# Patient Record
Sex: Female | Born: 1990 | Race: White | Hispanic: No | Marital: Single | Smoking: Never smoker
Health system: Southern US, Community
[De-identification: ages and names within clinical notes are randomized; demographics above are authoritative.]

## PROBLEM LIST (undated history)

## (undated) DIAGNOSIS — N63 Unspecified lump in unspecified breast: Secondary | ICD-10-CM

---

## 2011-06-15 ENCOUNTER — Emergency Department: Payer: Self-pay | Admitting: Emergency Medicine

## 2014-03-24 ENCOUNTER — Ambulatory Visit: Payer: Self-pay | Admitting: Physician Assistant

## 2014-03-24 LAB — URINALYSIS, COMPLETE
Glucose,UR: NEGATIVE
Ketone: 15
Leukocyte Esterase: NEGATIVE
Nitrite: NEGATIVE
Ph: 5.5 (ref 5.0–8.0)
Protein: 30
RBC,UR: >30 /HPF (ref 0–5)
Specific Gravity: >=1.03 (ref 1.000–1.030)
WBC UR: NONE SEEN /HPF (ref 0–5)

## 2014-03-24 LAB — CBC WITH DIFFERENTIAL/PLATELET
Basophil #: 0.1 10*3/uL (ref 0.0–0.1)
Basophil %: 0.5 %
Eosinophil #: 0.2 10*3/uL (ref 0.0–0.7)
Eosinophil %: 1.8 %
HCT: 40.1 % (ref 35.0–47.0)
HGB: 13.4 g/dL (ref 12.0–16.0)
Lymphocyte #: 2.5 10*3/uL (ref 1.0–3.6)
Lymphocyte %: 23.4 %
MCH: 31 pg (ref 26.0–34.0)
MCHC: 33.5 g/dL (ref 32.0–36.0)
MCV: 93 fL (ref 80–100)
Monocyte #: 0.7 x10 3/mm (ref 0.2–0.9)
Monocyte %: 6.9 %
Neutrophil #: 7.2 10*3/uL — ABNORMAL HIGH (ref 1.4–6.5)
Neutrophil %: 67.4 %
Platelet: 270 10*3/uL (ref 150–440)
RBC: 4.33 10*6/uL (ref 3.80–5.20)
RDW: 12.3 % (ref 11.5–14.5)
WBC: 10.7 10*3/uL (ref 3.6–11.0)

## 2014-03-24 LAB — COMPREHENSIVE METABOLIC PANEL
Albumin: 4.3 g/dL (ref 3.4–5.0)
Alkaline Phosphatase: 55 U/L
Anion Gap: 8 (ref 7–16)
BUN: 12 mg/dL (ref 7–18)
Bilirubin,Total: 0.7 mg/dL (ref 0.2–1.0)
Calcium, Total: 9 mg/dL (ref 8.5–10.1)
Chloride: 101 mmol/L (ref 98–107)
Co2: 31 mmol/L (ref 21–32)
Creatinine: 0.75 mg/dL (ref 0.60–1.30)
EGFR (African American): >60
EGFR (Non-African Amer.): >60
Glucose: 90 mg/dL (ref 65–99)
Osmolality: 279 (ref 275–301)
Potassium: 3.7 mmol/L (ref 3.5–5.1)
SGOT(AST): 11 U/L — ABNORMAL LOW (ref 15–37)
SGPT (ALT): 13 U/L — ABNORMAL LOW
Sodium: 140 mmol/L (ref 136–145)
Total Protein: 7.5 g/dL (ref 6.4–8.2)

## 2014-03-26 LAB — URINE CULTURE

## 2014-11-19 ENCOUNTER — Other Ambulatory Visit: Payer: Self-pay | Admitting: Physician Assistant

## 2014-11-19 ENCOUNTER — Ambulatory Visit
Admission: RE | Admit: 2014-11-19 | Discharge: 2014-11-19 | Disposition: A | Discharge status: Home / Self Care | Payer: BLUE CROSS/BLUE SHIELD | Source: Ambulatory Visit | Attending: Physician Assistant | Admitting: Physician Assistant

## 2014-11-19 DIAGNOSIS — M546 Pain in thoracic spine: Secondary | ICD-10-CM

## 2014-11-19 DIAGNOSIS — M545 Low back pain: Secondary | ICD-10-CM | POA: Insufficient documentation

## 2016-12-29 ENCOUNTER — Other Ambulatory Visit: Payer: Self-pay | Admitting: Obstetrics & Gynecology

## 2016-12-29 DIAGNOSIS — N6311 Unspecified lump in the right breast, upper outer quadrant: Secondary | ICD-10-CM

## 2017-01-04 ENCOUNTER — Ambulatory Visit
Admission: RE | Admit: 2017-01-04 | Discharge: 2017-01-04 | Disposition: A | Discharge status: Home / Self Care | Payer: Self-pay | Source: Ambulatory Visit | Attending: Obstetrics & Gynecology | Admitting: Obstetrics & Gynecology

## 2017-01-04 DIAGNOSIS — N6311 Unspecified lump in the right breast, upper outer quadrant: Secondary | ICD-10-CM

## 2017-01-04 HISTORY — DX: Unspecified lump in unspecified breast: N63.0

## 2017-08-24 ENCOUNTER — Ambulatory Visit
Admission: EM | Admit: 2017-08-24 | Discharge: 2017-08-24 | Disposition: A | Discharge status: Home / Self Care | Payer: Self-pay | Attending: Family Medicine | Admitting: Family Medicine

## 2017-08-24 ENCOUNTER — Encounter: Payer: Self-pay | Admitting: Emergency Medicine

## 2017-08-24 ENCOUNTER — Other Ambulatory Visit: Payer: Self-pay

## 2017-08-24 DIAGNOSIS — F410 Panic disorder [episodic paroxysmal anxiety] without agoraphobia: Secondary | ICD-10-CM

## 2017-08-24 MED ORDER — CITALOPRAM HYDROBROMIDE 10 MG PO TABS: 10.0000 mg | ORAL_TABLET | Freq: Every day | ORAL | 0 refills | Status: DC

## 2017-08-24 NOTE — ED Triage Notes (Signed)
Patient states she had a panic attack last night and felt her heart pounding and heart fluttering

## 2017-08-24 NOTE — ED Provider Notes (Signed)
[facility]  CSN: [id number] Arrival date & time: 08/24/17  1717  History   Chief Complaint Chief Complaint  Patient presents with  . Panic Attack   HPI  27 year old female presents with the above complaint.  Patient states that yesterday  around 2 PM she was driving.  She states that she felt her heart racing/fluttering.  She states that she had chest tightness, shortness of breath, and chest pressure.  She states that after rest and deep breathing, her symptoms improved.  Patient feels that she had a panic attack.  She has had ongoing stressors.  She reports life stressors and a recent death in the family.  She states that she has not grieved the loss very well.  She currently feels anxious and still has a sensation of chest pressure.  She is feeling improved.  She feels anxious.  No other associated symptoms.  No known inciting factor.  Possibly exacerbated by ongoing stressors.  No other complaints.  Past Medical History:  Diagnosis Date  . Breast mass    Surgical hx: No past surgeries.  Home Medications    Prior to Admission medications   Medication Sig Start Date End Date Taking? Authorizing Provider  citalopram (CELEXA) 10 MG tablet Take 1 tablet (10 mg total) by mouth daily. 08/24/17   Tommie Samsook, Keiron Iodice G, DO   Family History Family History  Problem Relation Age of Onset  . Breast cancer Paternal Grandmother   . Diabetes Other    Social History Social History   Tobacco Use  . Smoking status: Never Smoker  Substance Use Topics  . Alcohol use: Yes  . Drug use: Yes    Types: Marijuana   Allergies   NKDA  Review of Systems Review of Systems  Respiratory: Positive for chest tightness and shortness of breath.   Cardiovascular: Positive for palpitations.  Psychiatric/Behavioral: The patient is nervous/anxious.    Physical Exam Triage Vital Signs ED Triage Vitals  Enc Vitals Group     BP 08/24/17 1730 121/61     Pulse Rate 08/24/17 1730 68     Resp  08/24/17 1730 16     Temp 08/24/17 1730 (!) 97.4 F (36.3 C)     Temp Source 08/24/17 1730 Oral     SpO2 08/24/17 1730 100 %     Weight 08/24/17 1726 150 lb (68 kg)     Height 08/24/17 1726 5\' 11"  (1.803 m)     Head Circumference --      Peak Flow --      Pain Score 08/24/17 1726 0     Pain Loc --      Pain Edu? --      Excl. in GC? --    Updated Vital Signs BP 121/61 (BP Location: Left Arm)   Pulse 68   Temp (!) 97.4 F (36.3 C) (Oral)   Resp 16   Ht 5\' 11"  (1.803 m)   Wt 150 lb (68 kg)   LMP 07/28/2017   SpO2 100%   BMI 20.92 kg/m   Physical Exam  Constitutional: She is oriented to person, place, and time. She appears well-developed. No distress.  HENT:  Head: Normocephalic and atraumatic.  Eyes: Conjunctivae are normal. Right eye exhibits no discharge. Left eye exhibits no discharge.  Neck: Neck supple. No thyromegaly present.  Cardiovascular: Normal rate and regular rhythm.  No murmur heard. Pulmonary/Chest: Effort normal and breath sounds normal. She has no wheezes. She has no rales.  Abdominal: Soft. She  exhibits no distension. There is no tenderness.  Neurological: She is alert and oriented to person, place, and time.  Psychiatric:  Anxious.  Nursing note and vitals reviewed.  UC Treatments / Results  Labs (all labs ordered are listed, but only abnormal results are displayed) Labs Reviewed - No data to display  EKG None Radiology No results found.  Procedures Procedures (including critical care time)  Medications Ordered in UC Medications - No data to display   Initial Impression / Assessment and Plan / UC Course  I have reviewed the triage vital signs and the nursing notes.  Pertinent labs & imaging results that were available during my care of the patient were reviewed by me and considered in my medical decision making (see chart for details).    27 year old female presents with panic attack.  Exam unremarkable.  Lengthy discussion with the  patient today.  Placing on Celexa.  Final Clinical Impressions(s) / UC Diagnoses   Final diagnoses:  Panic attack    ED Discharge Orders        Ordered    citalopram (CELEXA) 10 MG tablet  Daily     08/24/17 1757     Controlled Substance Prescriptions [place] Controlled Substance Registry consulted? Not Applicable   Tommie Sams, DO 08/24/17 1815

## 2017-11-19 ENCOUNTER — Other Ambulatory Visit: Payer: Self-pay | Admitting: Family Medicine

## 2019-01-30 ENCOUNTER — Encounter: Payer: Self-pay | Admitting: Obstetrics and Gynecology

## 2019-01-30 ENCOUNTER — Ambulatory Visit (INDEPENDENT_AMBULATORY_CARE_PROVIDER_SITE_OTHER): Payer: Medicaid Other | Admitting: Obstetrics and Gynecology

## 2019-01-30 ENCOUNTER — Other Ambulatory Visit: Payer: Self-pay

## 2019-01-30 VITALS — BP 107/67 | HR 88 | Ht 70.0 in | Wt 157.9 lb

## 2019-01-30 DIAGNOSIS — N926 Irregular menstruation, unspecified: Secondary | ICD-10-CM | POA: Diagnosis not present

## 2019-01-30 DIAGNOSIS — Z3491 Encounter for supervision of normal pregnancy, unspecified, first trimester: Secondary | ICD-10-CM

## 2019-01-30 LAB — OB RESULTS CONSOLE VARICELLA ZOSTER ANTIBODY, IGG: Varicella: IMMUNE

## 2019-01-30 LAB — OB RESULTS CONSOLE GC/CHLAMYDIA: Gonorrhea: NEGATIVE

## 2019-01-30 LAB — POCT URINE PREGNANCY: Preg Test, Ur: POSITIVE — AB

## 2019-01-30 MED ORDER — ONDANSETRON 4 MG PO TBDP: 4.0000 mg | ORAL_TABLET | Freq: Four times a day (QID) | ORAL | 0 refills | Status: DC | PRN

## 2019-01-30 MED ORDER — PROMETHAZINE HCL 25 MG PO TABS: 25.0000 mg | ORAL_TABLET | Freq: Four times a day (QID) | ORAL | 2 refills | Status: DC | PRN

## 2019-01-30 NOTE — Progress Notes (Signed)
  Subjective:     Patient ID: Alicia Stephens, female   DOB: 1990/09/28, 28 y.o.   MRN: [id number]  HPI  28yo engaged white female.  Here for pregnancy confirmation, reports LMP 11/14/2018, which gives EDC 08/21/19 and EGA [redacted]w[redacted]d. Reports nausea daily, some worse than others, and +home Aug 14th. This is her first pregnancy. desires meds for nausea for prn use.  She is working [employer]. Is sexually active with female partner.   Review of Systems  Constitutional: Positive for fatigue.  Gastrointestinal: Positive for nausea and vomiting.       Objective:   Physical Exam A&Ox4 Well groomed female in no distress Vitals with BMI 01/30/2019 08/24/2017  Height 5\' 10"  5\' 11"   Weight 157 lbs 14 oz 150 lbs  BMI 26.33 35.45  Systolic 625 638  Diastolic 67 61  Pulse 88 68  UPT+ FHT 165    Assessment:     Missed menses Nausea and vomiting    Plan:     Will draw prenatal panel with genetic screening today. Phenergan and zofran ordered for prn use RTC in 2 weeks for New OB PE. Congratulated on pregnancy and reviewed routine prenatal care.   Melody Shambley,CNM

## 2019-01-30 NOTE — Patient Instructions (Signed)
Second Trimester of Pregnancy The second trimester is from week 14 through week 27 (months 4 through 6). The second trimester is often a time when you feel your best. Your body has adjusted to being pregnant, and you begin to feel better physically. Usually, morning sickness has lessened or quit completely, you may have more energy, and you may have an increase in appetite. The second trimester is also a time when the fetus is growing rapidly. At the end of the sixth month, the fetus is about 9 inches long and weighs about 1 pounds. You will likely begin to feel the baby move (quickening) between 16 and 20 weeks of pregnancy. Body changes during your second trimester Your body continues to go through many changes during your second trimester. The changes vary from woman to woman.  Your weight will continue to increase. You will notice your lower abdomen bulging out.  You may begin to get stretch marks on your hips, abdomen, and breasts.  You may develop headaches that can be relieved by medicines. The medicines should be approved by your health care provider.  You may urinate more often because the fetus is pressing on your bladder.  You may develop or continue to have heartburn as a result of your pregnancy.  You may develop constipation because certain hormones are causing the muscles that push waste through your intestines to slow down.  You may develop hemorrhoids or swollen, bulging veins (varicose veins).  You may have back pain. This is caused by: ? Weight gain. ? Pregnancy hormones that are relaxing the joints in your pelvis. ? A shift in weight and the muscles that support your balance.  Your breasts will continue to grow and they will continue to become tender.  Your gums may bleed and may be sensitive to brushing and flossing.  Dark spots or blotches (chloasma, mask of pregnancy) may develop on your face. This will likely fade after the baby is born.  A dark line from your  belly button to the pubic area (linea nigra) may appear. This will likely fade after the baby is born.  You may have changes in your hair. These can include thickening of your hair, rapid growth, and changes in texture. Some women also have hair loss during or after pregnancy, or hair that feels dry or thin. Your hair will most likely return to normal after your baby is born. What to expect at prenatal visits During a routine prenatal visit:  You will be weighed to make sure you and the fetus are growing normally.  Your blood pressure will be taken.  Your abdomen will be measured to track your baby's growth.  The fetal heartbeat will be listened to.  Any test results from the previous visit will be discussed. Your health care provider may ask you:  How you are feeling.  If you are feeling the baby move.  If you have had any abnormal symptoms, such as leaking fluid, bleeding, severe headaches, or abdominal cramping.  If you are using any tobacco products, including cigarettes, chewing tobacco, and electronic cigarettes.  If you have any questions. Other tests that may be performed during your second trimester include:  Blood tests that check for: ? Low iron levels (anemia). ? High blood sugar that affects pregnant women (gestational diabetes) between 28 and 28 weeks. ? Rh antibodies. This is to check for a protein on red blood cells (Rh factor).  Urine tests to check for infections, diabetes, or protein in the  urine.  An ultrasound to confirm the proper growth and development of the baby.  An amniocentesis to check for possible genetic problems.  Fetal screens for spina bifida and Down syndrome.  HIV (human immunodeficiency virus) testing. Routine prenatal testing includes screening for HIV, unless you choose not to have this test. Follow these instructions at home: Medicines  Follow your health care provider's instructions regarding medicine use. Specific medicines may be  either safe or unsafe to take during pregnancy.  Take a prenatal vitamin that contains at least 600 micrograms (mcg) of folic acid.  If you develop constipation, try taking a stool softener if your health care provider approves. Eating and drinking   Eat a balanced diet that includes fresh fruits and vegetables, whole grains, good sources of protein such as meat, eggs, or tofu, and low-fat dairy. Your health care provider will help you determine the amount of weight gain that is right for you.  Avoid raw meat and uncooked cheese. These carry germs that can cause birth defects in the baby.  If you have low calcium intake from food, talk to your health care provider about whether you should take a daily calcium supplement.  Limit foods that are high in fat and processed sugars, such as fried and sweet foods.  To prevent constipation: ? Drink enough fluid to keep your urine clear or pale yellow. ? Eat foods that are high in fiber, such as fresh fruits and vegetables, whole grains, and beans. Activity  Exercise only as directed by your health care provider. Most women can continue their usual exercise routine during pregnancy. Try to exercise for 30 minutes at least 5 days a week. Stop exercising if you experience uterine contractions.  Avoid heavy lifting, wear low heel shoes, and practice good posture.  A sexual relationship may be continued unless your health care provider directs you otherwise. Relieving pain and discomfort  Wear a good support bra to prevent discomfort from breast tenderness.  Take warm sitz baths to soothe any pain or discomfort caused by hemorrhoids. Use hemorrhoid cream if your health care provider approves.  Rest with your legs elevated if you have leg cramps or low back pain.  If you develop varicose veins, wear support hose. Elevate your feet for 15 minutes, 3-4 times a day. Limit salt in your diet. Prenatal Care  Write down your questions. Take them to  your prenatal visits.  Keep all your prenatal visits as told by your health care provider. This is important. Safety  Wear your seat belt at all times when driving.  Make a list of emergency phone numbers, including numbers for family, friends, the hospital, and police and fire departments. General instructions  Ask your health care provider for a referral to a local prenatal education class. Begin classes no later than the beginning of month 6 of your pregnancy.  Ask for help if you have counseling or nutritional needs during pregnancy. Your health care provider can offer advice or refer you to specialists for help with various needs.  Do not use hot tubs, steam rooms, or saunas.  Do not douche or use tampons or scented sanitary pads.  Do not cross your legs for long periods of time.  Avoid cat litter boxes and soil used by cats. These carry germs that can cause birth defects in the baby and possibly loss of the fetus by miscarriage or stillbirth.  Avoid all smoking, herbs, alcohol, and unprescribed drugs. Chemicals in these products can affect the formation  and growth of the baby.  Do not use any products that contain nicotine or tobacco, such as cigarettes and e-cigarettes. If you need help quitting, ask your health care provider.  Visit your dentist if you have not gone yet during your pregnancy. Use a soft toothbrush to brush your teeth and be gentle when you floss. Contact a health care provider if:  You have dizziness.  You have mild pelvic cramps, pelvic pressure, or nagging pain in the abdominal area.  You have persistent nausea, vomiting, or diarrhea.  You have a bad smelling vaginal discharge.  You have pain when you urinate. Get help right away if:  You have a fever.  You are leaking fluid from your vagina.  You have spotting or bleeding from your vagina.  You have severe abdominal cramping or pain.  You have rapid weight gain or weight loss.  You have  shortness of breath with chest pain.  You notice sudden or extreme swelling of your face, hands, ankles, feet, or legs.  You have not felt your baby move in over an hour.  You have severe headaches that do not go away when you take medicine.  You have vision changes. Summary  The second trimester is from week 14 through week 27 (months 4 through 6). It is also a time when the fetus is growing rapidly.  Your body goes through many changes during pregnancy. The changes vary from woman to woman.  Avoid all smoking, herbs, alcohol, and unprescribed drugs. These chemicals affect the formation and growth your baby.  Do not use any tobacco products, such as cigarettes, chewing tobacco, and e-cigarettes. If you need help quitting, ask your health care provider.  Contact your health care provider if you have any questions. Keep all prenatal visits as told by your health care provider. This is important. This information is not intended to replace advice given to you by your health care provider. Make sure you discuss any questions you have with your health care provider. Document Released: 04/12/2001 Document Revised: 08/10/2018 Document Reviewed: 05/24/2016 Elsevier Patient Education  2020 [employer]. Commonly Asked Questions During Pregnancy  Cats: A parasite can be excreted in cat feces.  To avoid exposure you need to have another person empty the little box.  If you must empty the litter box you will need to wear gloves.  Wash your hands after handling your cat.  This parasite can also be found in raw or undercooked meat so this should also be avoided.  Colds, Sore Throats, Flu: Please check your medication sheet to see what you can take for symptoms.  If your symptoms are unrelieved by these medications please call the office.  Dental Work: Most any dental work Investment banker, corporate recommends is permitted.  X-rays should only be taken during the first trimester if absolutely necessary.  Your  abdomen should be shielded with a lead apron during all x-rays.  Please notify your provider prior to receiving any x-rays.  Novocaine is fine; gas is not recommended.  If your dentist requires a note from [place] prior to dental work please call the office and we will provide one for you.  Exercise: Exercise is an important part of staying healthy during your pregnancy.  You may continue most exercises you were accustomed to prior to pregnancy.  Later in your pregnancy you will most likely notice you have difficulty with activities requiring balance like riding a bicycle.  It is important that you listen to your body and  avoid activities that put you at a higher risk of falling.  Adequate rest and staying well hydrated are a must!  If you have questions about the safety of specific activities ask your provider.    Exposure to Children with illness: Try to avoid obvious exposure; report any symptoms to [place] when noted,  If you have chicken pos, red measles or mumps, you should be immune to these diseases.   Please do not take any vaccines while pregnant unless you have checked with your OB provider.  Fetal Movement: After 28 weeks we recommend you do "kick counts" twice daily.  Lie or sit down in a calm quiet environment and count your baby movements "kicks".  You should feel your baby at least 10 times per hour.  If you have not felt 10 kicks within the first hour get up, walk around and have something sweet to eat or drink then repeat for an additional hour.  If count remains less than 10 per hour notify your provider.  Fumigating: Follow your pest control agent's advice as to how long to stay out of your home.  Ventilate the area well before re-entering.  Hemorrhoids:   Most over-the-counter preparations can be used during pregnancy.  Check your medication to see what is safe to use.  It is important to use a stool softener or fiber in your diet and to drink lots of liquids.  If hemorrhoids seem to be getting  worse please call the office.   Hot Tubs:  Hot tubs Jacuzzis and saunas are not recommended while pregnant.  These increase your internal body temperature and should be avoided.  Intercourse:  Sexual intercourse is safe during pregnancy as long as you are comfortable, unless otherwise advised by your provider.  Spotting may occur after intercourse; report any bright red bleeding that is heavier than spotting.  Labor:  If you know that you are in labor, please go to the hospital.  If you are unsure, please call the office and let us help you decide what to do.  Lifting, straining, etc:  If your job requires heavy lifting or straining please check with your provider for any limitations.  Generally, you should not lift items heavier than that you can lift simply with your hands and arms (no back muscles)  Painting:  Paint fumes do not harm your pregnancy, but may make you ill and should be avoided if possible.  Latex or water based paints have less odor than oils.  Use adequate ventilation while painting.  Permanents & Hair Color:  Chemicals in hair dyes are not recommended as they cause increase hair dryness which can increase hair loss during pregnancy.  " Highlighting" and permanents are allowed.  Dye may be absorbed differently and permanents may not hold as well during pregnancy.  Sunbathing:  Use a sunscreen, as skin burns easily during pregnancy.  Drink plenty of fluids; avoid over heating.  Tanning Beds:  Because their possible side effects are still unknown, tanning beds are not recommended.  Ultrasound Scans:  Routine ultrasounds are performed at approximately 20 weeks.  You will be able to see your baby's general anatomy an if you would like to know the gender this can usually be determined as well.  If it is questionable when you conceived you may also receive an ultrasound early in your pregnancy for dating purposes.  Otherwise ultrasound exams are not routinely performed unless there is  a medical necessity.  Although you can request a scan  we ask that you pay for it when conducted because insurance does not cover " patient request" scans.  Work: If your pregnancy proceeds without complications you may work until your due date, unless your physician or employer advises otherwise.  Round Ligament Pain/Pelvic Discomfort:  Sharp, shooting pains not associated with bleeding are fairly common, usually occurring in the second trimester of pregnancy.  They tend to be worse when standing up or when you remain standing for long periods of time.  These are the result of pressure of certain pelvic ligaments called "round ligaments".  Rest, Tylenol and heat seem to be the most effective relief.  As the womb and fetus grow, they rise out of the pelvis and the discomfort improves.  Please notify the office if your pain seems different than that described.  It may represent a more serious condition.  Common Medications Safe in Pregnancy  Acne:      Constipation:  Benzoyl Peroxide     Colace  Clindamycin      Dulcolax Suppository  Topica Erythromycin     Fibercon  Salicylic Acid      Metamucil         Miralax AVOID:        Senakot   Accutane    Cough:  Retin-A       Cough Drops  Tetracycline      Phenergan w/ Codeine if Rx  Minocycline      Robitussin (Plain & DM)  Antibiotics:     Crabs/Lice:  Ceclor       RID  Cephalosporins    AVOID:  E-Mycins      Kwell  Keflex  Macrobid/Macrodantin   Diarrhea:  Penicillin      Kao-Pectate  Zithromax      Imodium AD         PUSH FLUIDS AVOID:       Cipro     Fever:  Tetracycline      Tylenol (Regular or Extra  Minocycline       Strength)  Levaquin      Extra Strength-Do not          Exceed 8 tabs/24 hrs Caffeine:        <213m/day (equiv. To 1 cup of coffee or  approx. 3 12 oz  sodas)         Gas: Cold/Hayfever:       Gas-X  Benadryl      Mylicon  Claritin       Phazyme  **Claritin-D        Chlor-Trimeton    Headaches:  Dimetapp      ASA-Free Excedrin  Drixoral-Non-Drowsy     Cold Compress  Mucinex (Guaifenasin)     Tylenol (Regular or Extra  Sudafed/Sudafed-12 Hour     Strength)  **Sudafed PE Pseudoephedrine   Tylenol Cold & Sinus     Vicks Vapor Rub  Zyrtec  **AVOID if Problems With Blood Pressure         Heartburn: Avoid lying down for at least 1 hour after meals  Aciphex      Maalox     Rash:  Milk of Magnesia     Benadryl    Mylanta       1% Hydrocortisone Cream  Pepcid  Pepcid Complete   Sleep Aids:  Prevacid      Ambien   Prilosec       Benadryl  Rolaids       Chamomile Tea  Tums (Limit  4/day)     Unisom  Zantac       Tylenol PM         Warm milk-add vanilla or  Hemorrhoids:       Sugar for taste  Anusol/Anusol H.C.  (RX: Analapram 2.5%)  Sugar Substitutes:  Hydrocortisone OTC     Ok in moderation  Preparation H      Tucks        Vaseline lotion applied to tissue with wiping    Herpes:     Throat:  Acyclovir      Oragel  Famvir  Valtrex     Vaccines:         Flu Shot Leg Cramps:       *Gardasil  Benadryl      Hepatitis A         Hepatitis B Nasal Spray:       Pneumovax  Saline Nasal Spray     Polio Booster         Tetanus Nausea:       Tuberculosis test or PPD  Vitamin B6 25 mg TID   AVOID:    Dramamine      *Gardasil  Emetrol       Live Poliovirus  Ginger Root 250 mg QID    MMR (measles, mumps &  High Complex Carbs @ Bedtime    rebella)  Sea Bands-Accupressure    Varicella (Chickenpox)  Unisom 1/2 tab TID     *No known complications           If received before Pain:         Known pregnancy;   Darvocet       Resume series after  Lortab        Delivery  Percocet    Yeast:   Tramadol      Femstat  Tylenol 3      Gyne-lotrimin  Ultram       Monistat  Vicodin           MISC:         All  Sunscreens           Hair Coloring/highlights          Insect Repellant's          (Including DEET)         Mystic Tans

## 2019-01-31 LAB — URINALYSIS, ROUTINE W REFLEX MICROSCOPIC
Bilirubin, UA: NEGATIVE
Glucose, UA: NEGATIVE
Ketones, UA: NEGATIVE
Leukocytes,UA: NEGATIVE
Nitrite, UA: NEGATIVE
Protein,UA: NEGATIVE
RBC, UA: NEGATIVE
Specific Gravity, UA: 1.02 (ref 1.005–1.030)
Urobilinogen, Ur: 0.2 mg/dL (ref 0.2–1.0)
pH, UA: 7 (ref 5.0–7.5)

## 2019-01-31 LAB — CBC WITH DIFFERENTIAL/PLATELET
Basophils Absolute: 0 10*3/uL (ref 0.0–0.2)
Basos: 0 %
EOS (ABSOLUTE): 0.1 10*3/uL (ref 0.0–0.4)
Eos: 1 %
Hematocrit: 38.8 % (ref 34.0–46.6)
Hemoglobin: 13.1 g/dL (ref 11.1–15.9)
Immature Grans (Abs): 0 10*3/uL (ref 0.0–0.1)
Immature Granulocytes: 0 %
Lymphocytes Absolute: 2 10*3/uL (ref 0.7–3.1)
Lymphs: 22 %
MCH: 30.4 pg (ref 26.6–33.0)
MCHC: 33.8 g/dL (ref 31.5–35.7)
MCV: 90 fL (ref 79–97)
Monocytes Absolute: 0.5 10*3/uL (ref 0.1–0.9)
Monocytes: 6 %
Neutrophils Absolute: 6.5 10*3/uL (ref 1.4–7.0)
Neutrophils: 71 %
Platelets: 346 10*3/uL (ref 150–450)
RBC: 4.31 x10E6/uL (ref 3.77–5.28)
RDW: 11.8 % (ref 11.7–15.4)
WBC: 9.2 10*3/uL (ref 3.4–10.8)

## 2019-01-31 LAB — RPR: RPR Ser Ql: NONREACTIVE

## 2019-01-31 LAB — TOXOPLASMA ANTIBODIES- IGG AND  IGM
Toxoplasma Antibody- IgM: <3 AU/mL (ref 0.0–7.9)
Toxoplasma IgG Ratio: <3 IU/mL (ref 0.0–7.1)

## 2019-01-31 LAB — RUBELLA SCREEN: Rubella Antibodies, IGG: 6.31 index (ref >0.99)

## 2019-01-31 LAB — ABO AND RH: Rh Factor: POSITIVE

## 2019-01-31 LAB — VARICELLA ZOSTER ANTIBODY, IGG: Varicella zoster IgG: 967 index (ref >165)

## 2019-01-31 LAB — HEPATITIS B SURFACE ANTIGEN: Hepatitis B Surface Ag: NEGATIVE

## 2019-01-31 LAB — ANTIBODY SCREEN: Antibody Screen: NEGATIVE

## 2019-01-31 LAB — HIV ANTIBODY (ROUTINE TESTING W REFLEX): HIV Screen 4th Generation wRfx: NONREACTIVE

## 2019-02-01 LAB — GC/CHLAMYDIA PROBE AMP
Chlamydia trachomatis, NAA: NEGATIVE
Neisseria Gonorrhoeae by PCR: NEGATIVE

## 2019-02-01 LAB — URINE CULTURE

## 2019-02-03 LAB — MONITOR DRUG PROFILE 14(MW)
Amphetamine Scrn, Ur: NEGATIVE ng/mL
BARBITURATE SCREEN URINE: NEGATIVE ng/mL
BENZODIAZEPINE SCREEN, URINE: NEGATIVE ng/mL
Buprenorphine, Urine: NEGATIVE ng/mL
Cocaine (Metab) Scrn, Ur: NEGATIVE ng/mL
Creatinine(Crt), U: 131.9 mg/dL (ref 20.0–300.0)
Fentanyl, Urine: NEGATIVE pg/mL
Meperidine Screen, Urine: NEGATIVE ng/mL
Methadone Screen, Urine: NEGATIVE ng/mL
OXYCODONE+OXYMORPHONE UR QL SCN: NEGATIVE ng/mL
Opiate Scrn, Ur: NEGATIVE ng/mL
Ph of Urine: 6.6 (ref 4.5–8.9)
Phencyclidine Qn, Ur: NEGATIVE ng/mL
Propoxyphene Scrn, Ur: NEGATIVE ng/mL
SPECIFIC GRAVITY: 1.018
Tramadol Screen, Urine: NEGATIVE ng/mL

## 2019-02-03 LAB — CANNABINOID (GC/MS), URINE
Cannabinoid: POSITIVE — AB
Carboxy THC (GC/MS): 145 ng/mL

## 2019-02-18 ENCOUNTER — Telehealth: Payer: Self-pay | Admitting: Obstetrics and Gynecology

## 2019-02-18 NOTE — Telephone Encounter (Signed)
Pt called to check the status of genetic screening results. Pt requesting a call back. Please advise.

## 2019-02-27 NOTE — Telephone Encounter (Signed)
I do not remember seeing them, cn we check with [facility]?

## 2019-02-28 ENCOUNTER — Other Ambulatory Visit: Payer: Self-pay

## 2019-02-28 ENCOUNTER — Encounter: Payer: Self-pay | Admitting: Obstetrics and Gynecology

## 2019-02-28 ENCOUNTER — Ambulatory Visit (INDEPENDENT_AMBULATORY_CARE_PROVIDER_SITE_OTHER): Payer: Medicaid Other | Admitting: Obstetrics and Gynecology

## 2019-02-28 ENCOUNTER — Telehealth: Payer: Self-pay

## 2019-02-28 ENCOUNTER — Other Ambulatory Visit (HOSPITAL_COMMUNITY)
Admission: RE | Admit: 2019-02-28 | Discharge: 2019-02-28 | Disposition: A | Discharge status: Home / Self Care | Payer: Medicaid Other | Source: Ambulatory Visit | Attending: Obstetrics and Gynecology | Admitting: Obstetrics and Gynecology

## 2019-02-28 VITALS — BP 105/70 | HR 89 | Wt 160.7 lb

## 2019-02-28 DIAGNOSIS — Z3402 Encounter for supervision of normal first pregnancy, second trimester: Secondary | ICD-10-CM | POA: Diagnosis present

## 2019-02-28 LAB — POCT URINALYSIS DIPSTICK OB
Bilirubin, UA: NEGATIVE
Blood, UA: NEGATIVE
Glucose, UA: NEGATIVE
Ketones, UA: NEGATIVE
Leukocytes, UA: NEGATIVE
Nitrite, UA: NEGATIVE
POC,PROTEIN,UA: NEGATIVE
Spec Grav, UA: 1.015 (ref 1.010–1.025)
Urobilinogen, UA: 0.2 E.U./dL
pH, UA: 7.5 (ref 5.0–8.0)

## 2019-02-28 MED ORDER — CITRANATAL BLOOM 90-1 MG PO TABS: 90.0000 mg | ORAL_TABLET | Freq: Every day | ORAL | 11 refills | Status: DC

## 2019-02-28 NOTE — Telephone Encounter (Signed)
mychart message sent to patient- call has been placed to [facility] at [facility] for [facility] [employer]

## 2019-02-28 NOTE — Patient Instructions (Signed)

## 2019-02-28 NOTE — Progress Notes (Signed)
Pt present for NOB PE. Pt stated that she was doing well no problems. Last pap unknown.

## 2019-03-06 LAB — CYTOLOGY - PAP: Diagnosis: NEGATIVE

## 2019-03-26 ENCOUNTER — Other Ambulatory Visit: Payer: Self-pay

## 2019-03-26 ENCOUNTER — Ambulatory Visit (INDEPENDENT_AMBULATORY_CARE_PROVIDER_SITE_OTHER): Payer: Medicaid Other | Admitting: Certified Nurse Midwife

## 2019-03-26 ENCOUNTER — Encounter: Payer: Self-pay | Admitting: Certified Nurse Midwife

## 2019-03-26 ENCOUNTER — Ambulatory Visit (INDEPENDENT_AMBULATORY_CARE_PROVIDER_SITE_OTHER): Payer: Medicaid Other

## 2019-03-26 VITALS — BP 105/62 | HR 74 | Wt 167.0 lb

## 2019-03-26 DIAGNOSIS — Z1389 Encounter for screening for other disorder: Secondary | ICD-10-CM

## 2019-03-26 DIAGNOSIS — Z3402 Encounter for supervision of normal first pregnancy, second trimester: Secondary | ICD-10-CM | POA: Diagnosis not present

## 2019-03-26 LAB — POCT URINALYSIS DIPSTICK OB
Bilirubin, UA: NEGATIVE
Blood, UA: NEGATIVE
Glucose, UA: NEGATIVE
Ketones, UA: NEGATIVE
Leukocytes, UA: NEGATIVE
Nitrite, UA: NEGATIVE
POC,PROTEIN,UA: NEGATIVE
Spec Grav, UA: 1.015 (ref 1.010–1.025)
Urobilinogen, UA: 0.2 E.U./dL
pH, UA: 6 (ref 5.0–8.0)

## 2019-03-26 NOTE — Addendum Note (Signed)
Addended by: Raliegh Ip on: 03/26/2019 04:11 PM   Modules accepted: Orders

## 2019-03-26 NOTE — Patient Instructions (Signed)

## 2019-03-26 NOTE — Progress Notes (Signed)
ROB and anatomy scan today. Result reviewed (see below-complete). Discussed dizziness in pregnancy-reviewed self help measures. Discussed round ligament. Pain. Follow up 4 wks.   Philip Aspen, CNM   Patient Name: Alicia Stephens DOB: 1991-01-21 MRN: [id number] ULTRASOUND REPORT  Location: [facility] Date of Service: 03/26/2019   Indications:Anatomy Ultrasound Findings:  Nelda Marseille intrauterine pregnancy is visualized with FHR at 152 BPM. Biometrics give an (U/S) Gestational age of [redacted]w[redacted]d and an (U/S) EDD of 08/18/2019; this correlates with the clinically established Estimated Date of Delivery: 08/21/19  Fetal presentation is Variable.  EFW: 275 g ( 10 oz). Fetal Percentile 36 Placenta: anterior. Grade: 1 AFI: subjectively normal.  Anatomic survey is complete and normal; Gender - female.    Right Ovary is normal in appearance. Left Ovary is normal appearance. Survey of the adnexa demonstrates no adnexal masses. There is no free peritoneal fluid in the cul de sac.  Impression: 1. [redacted]w[redacted]d Viable Singleton Intrauterine pregnancy by U/S. 2. (U/S) EDD is consistent with Clinically established Estimated Date of Delivery: 08/21/19 . 3. Normal Anatomy Scan  Recommendations: 1.Clinical correlation with the patient's History and Physical Exam.   Jenine M. Albertine Grates    RDMS

## 2019-03-27 ENCOUNTER — Telehealth: Payer: Self-pay | Admitting: Certified Nurse Midwife

## 2019-03-27 ENCOUNTER — Telehealth: Payer: Self-pay

## 2019-03-27 NOTE — Telephone Encounter (Signed)
Pt called in she is currently at the dentist and they need documention  faxed stating they have permission to do ex-rays. Please advise. [facility] Fax [phone]

## 2019-03-27 NOTE — Telephone Encounter (Signed)
Dental letter faxed to [phone] and confirmation received.

## 2019-04-17 ENCOUNTER — Other Ambulatory Visit: Payer: Self-pay

## 2019-04-17 ENCOUNTER — Other Ambulatory Visit: Payer: Self-pay | Admitting: Certified Nurse Midwife

## 2019-04-17 ENCOUNTER — Telehealth: Payer: Self-pay

## 2019-04-17 MED ORDER — VITAFOL FE+ 90-0.6-0.4-200 MG PO CAPS: 1.0000 | ORAL_CAPSULE | Freq: Every day | ORAL | 9 refills | Status: DC

## 2019-04-17 MED ORDER — CITRANATAL BLOOM 90-1 MG PO TABS: 90.0000 mg | ORAL_TABLET | Freq: Every day | ORAL | 11 refills | Status: DC

## 2019-04-17 NOTE — Telephone Encounter (Signed)
Refill sent per patient request.

## 2019-04-17 NOTE — Telephone Encounter (Signed)
Pt request refill Prenatal vitamins to [facility] on file.

## 2019-04-17 NOTE — Telephone Encounter (Signed)
Message from [place] unavailable. Replaced with Vitafol FE. Script sent to pharmacy.

## 2019-04-23 ENCOUNTER — Encounter: Payer: Self-pay | Admitting: Certified Nurse Midwife

## 2019-04-23 ENCOUNTER — Ambulatory Visit (INDEPENDENT_AMBULATORY_CARE_PROVIDER_SITE_OTHER): Payer: Medicaid Other | Admitting: Certified Nurse Midwife

## 2019-04-23 ENCOUNTER — Other Ambulatory Visit: Payer: Self-pay

## 2019-04-23 VITALS — BP 109/67 | HR 85 | Wt 173.6 lb

## 2019-04-23 DIAGNOSIS — Z3402 Encounter for supervision of normal first pregnancy, second trimester: Secondary | ICD-10-CM

## 2019-04-23 NOTE — Progress Notes (Signed)
ROB doing well. Feels good movement. Discussed 28 wk labs/visit. Pt verbalize and agree. Follow up 5 wks.   Philip Aspen, CNM

## 2019-04-23 NOTE — Patient Instructions (Signed)

## 2019-05-03 NOTE — L&D Delivery Note (Signed)
       Delivery Note   [place] Alexis Sumpter is a 29 y.o. G1P0 at [redacted]w[redacted]d Estimated Date of Delivery: 08/21/19  PRE-OPERATIVE DIAGNOSIS:  1) [redacted]w[redacted]d pregnancy.   POST-OPERATIVE DIAGNOSIS:  1) [redacted]w[redacted]d pregnancy s/p Vaginal, Spontaneous   Delivery Type: Vaginal, Spontaneous    Delivery Anesthesia: Epidural   Labor Complications: none    ESTIMATED BLOOD LOSS: 300 ml    FINDINGS:   1) female infant, Apgar scores of 7   at 1 minute and 9   at 5 minutes and a birthweight of 137.92  ounces.    2) Nuchal cord: no  SPECIMENS:   PLACENTA:   Appearance: Intact , 3 vessel cord   Removal: Spontaneous      Disposition:  held per protocol then released to pt.   DISPOSITION:  Infant to left in stable condition in the delivery room, with L&D personnel and mother,  NARRATIVE SUMMARY: Labor course:  Ms. [place] Alexis Buntrock is a G1P0 at [redacted]w[redacted]d who presented for induction of labor.  Foley bulb was placed in the office prior to admission. She progressed well in labor with cytotec, AROM and  pitocin.  She received the appropriate epidural anesthesia and proceeded to complete dilation. She evidenced good maternal expulsive effort during the second stage. She went on to deliver a viable female infant ":Ophelia". The placenta delivered without problems and was noted to be complete. A perineal and vaginal examination was performed. Lacerations: Vaginal , bilateral side wall. The lacerations were repaired with a 3-0 Vicryl Rapide suture. The patient tolerated this well. 800 mcg cytotec placed rectally due to scant to moderate bleeding. Will continue to monitor.   Doreene Burke, CNM 08/27/2019 5:40 PM

## 2019-05-06 ENCOUNTER — Telehealth: Payer: Self-pay | Admitting: Certified Nurse Midwife

## 2019-05-06 ENCOUNTER — Telehealth: Payer: Self-pay

## 2019-05-06 NOTE — Telephone Encounter (Signed)
Mychart message sent to patient.

## 2019-05-06 NOTE — Telephone Encounter (Signed)
Pt called in pt is having heartburn pt asked was pepcid complete. Pt is requesting a call.

## 2019-05-28 ENCOUNTER — Other Ambulatory Visit: Payer: Medicaid Other

## 2019-05-28 ENCOUNTER — Other Ambulatory Visit: Payer: Self-pay

## 2019-05-28 ENCOUNTER — Ambulatory Visit (INDEPENDENT_AMBULATORY_CARE_PROVIDER_SITE_OTHER): Payer: Medicaid Other | Admitting: Certified Nurse Midwife

## 2019-05-28 ENCOUNTER — Encounter: Payer: Self-pay | Admitting: Certified Nurse Midwife

## 2019-05-28 DIAGNOSIS — Z23 Encounter for immunization: Secondary | ICD-10-CM | POA: Diagnosis not present

## 2019-05-28 DIAGNOSIS — Z3402 Encounter for supervision of normal first pregnancy, second trimester: Secondary | ICD-10-CM | POA: Diagnosis not present

## 2019-05-28 LAB — POCT URINALYSIS DIPSTICK OB
Bilirubin, UA: NEGATIVE
Blood, UA: NEGATIVE
Glucose, UA: NEGATIVE
Ketones, UA: NEGATIVE
Leukocytes, UA: NEGATIVE
Nitrite, UA: NEGATIVE
POC,PROTEIN,UA: NEGATIVE
Spec Grav, UA: 1.02 (ref 1.010–1.025)
Urobilinogen, UA: 0.2 E.U./dL
pH, UA: 5 (ref 5.0–8.0)

## 2019-05-28 MED ORDER — VITAFOL FE+ 90-0.6-0.4-200 MG PO CAPS: 1.0000 | ORAL_CAPSULE | Freq: Every day | ORAL | 9 refills | Status: DC

## 2019-05-28 MED ORDER — TETANUS-DIPHTH-ACELL PERTUSSIS 5-2.5-18.5 LF-MCG/0.5 IM SUSP
0.5000 mL | Freq: Once | INTRAMUSCULAR | Status: AC
  Administered 2019-05-28: 0.5 mL via INTRAMUSCULAR

## 2019-05-28 NOTE — Telephone Encounter (Signed)
Prenatal vits refill sent

## 2019-05-28 NOTE — Progress Notes (Signed)
ROB doing well. Feels good movement. 28 wk labs today. Glucose/Tdap/BTC/CBC/RPR. Discussed BC after delivery, booklet given. Sample birth plan given. Pt encoraged to review with her partner . Will follow up at next appointment. She has a breast pump that her friend got for her. She is having a dental procedure-not given. Baby name is Patent attorney. Follow up 2 wks,   Doreene Burke, CNM

## 2019-05-28 NOTE — Patient Instructions (Signed)
Td (Tetanus, Diphtheria) Vaccine: What You Need to Know 1. Why get vaccinated? Td vaccine can prevent tetanus and diphtheria. Tetanus enters the body through cuts or wounds. Diphtheria spreads from person to person.  TETANUS (T) causes painful stiffening of the muscles. Tetanus can lead to serious health problems, including being unable to open the mouth, having trouble swallowing and breathing, or death.  DIPHTHERIA (D) can lead to difficulty breathing, heart failure, paralysis, or death. 2. Td vaccine Td is only for children 7 years and older, adolescents, and adults.  Td is usually given as a booster dose every 10 years, but it can also be given earlier after a severe and dirty wound or burn. Another vaccine, called Tdap, that protects against pertussis, also known as "whooping cough," in addition to tetanus and diphtheria, may be used instead of Td.  Td may be given at the same time as other vaccines. 3. Talk with your health care provider Tell your vaccine provider if the person getting the vaccine:  Has had an allergic reaction after a previous dose of any vaccine that protects against tetanus or diphtheria, or has any severe, life-threatening allergies.  Has ever had Guillain-Barr Syndrome (also called GBS).  Has had severe pain or swelling after a previous dose of any vaccine that protects against tetanus or diphtheria. In some cases, your health care provider may decide to postpone Td vaccination to a future visit.  People with minor illnesses, such as a cold, may be vaccinated. People who are moderately or severely ill should usually wait until they recover before getting Td vaccine.  Your health care provider can give you more information. 4. Risks of a vaccine reaction  Pain, redness, or swelling where the shot was given, mild fever, headache, feeling tired, and nausea, vomiting, diarrhea, or stomachache sometimes happen after Td vaccine. People sometimes faint after medical  procedures, including vaccination. Tell your provider if you feel dizzy or have vision changes or ringing in the ears.  As with any medicine, there is a very remote chance of a vaccine causing a severe allergic reaction, other serious injury, or death. 5. What if there is a serious problem? An allergic reaction could occur after the vaccinated person leaves the clinic. If you see signs of a severe allergic reaction (hives, swelling of the face and throat, difficulty breathing, a fast heartbeat, dizziness, or weakness), call 9-1-1 and get the person to the nearest hospital.  For other signs that concern you, call your health care provider.  Adverse reactions should be reported to the Vaccine Adverse Event Reporting System (VAERS). Your health care provider will usually file this report, or you can do it yourself. Visit the VAERS website at www.vaers.hhs.gov or call 1-800-822-7967. VAERS is only for reporting reactions, and VAERS staff do not give medical advice. 6. The National Vaccine Injury Compensation Program The National Vaccine Injury Compensation Program (VICP) is a federal program that was created to compensate people who may have been injured by certain vaccines. Visit the VICP website at www.hrsa.gov/vaccinecompensation or call 1-800-338-2382 to learn about the program and about filing a claim. There is a time limit to file a claim for compensation. 7. How can I learn more?  Ask your health care provider.  Call your local or state health department.  Contact the Centers for Disease Control and Prevention (CDC): ? Call 1-800-232-4636 (1-800-CDC-INFO) or ? Visit CDC's website at www.cdc.gov/vaccines Vaccine Information Statement Td Vaccine (08/01/18) This information is not intended to replace advice given   to you by your health care provider. Make sure you discuss any questions you have with your health care provider. Document Revised: 09/10/2018 Document Reviewed: 08/13/2018 Elsevier  Patient Education  2020 Elsevier Inc. Glucose Tolerance Test During Pregnancy Why am I having this test? The glucose tolerance test (GTT) is done to check how your body processes sugar (glucose). This is one of several tests used to diagnose diabetes that develops during pregnancy (gestational diabetes mellitus). Gestational diabetes is a temporary form of diabetes that some women develop during pregnancy. It usually occurs during the second trimester of pregnancy and goes away after delivery. Testing (screening) for gestational diabetes usually occurs between 24 and 28 weeks of pregnancy. You may have the GTT test after having a 1-hour glucose screening test if the results from that test indicate that you may have gestational diabetes. You may also have this test if:  You have a history of gestational diabetes.  You have a history of giving birth to very large babies or have experienced repeated fetal loss (stillbirth).  You have signs and symptoms of diabetes, such as: ? Changes in your vision. ? Tingling or numbness in your hands or feet. ? Changes in hunger, thirst, and urination that are not otherwise explained by your pregnancy. What is being tested? This test measures the amount of glucose in your blood at different times during a period of 3 hours. This indicates how well your body is able to process glucose. What kind of sample is taken?  Blood samples are required for this test. They are usually collected by inserting a needle into a blood vessel. How do I prepare for this test?  For 3 days before your test, eat normally. Have plenty of carbohydrate-rich foods.  Follow instructions from your health care provider about: ? Eating or drinking restrictions on the day of the test. You may be asked to not eat or drink anything other than water (fast) starting 8-10 hours before the test. ? Changing or stopping your regular medicines. Some medicines may interfere with this test. Tell a  health care provider about:  All medicines you are taking, including vitamins, herbs, eye drops, creams, and over-the-counter medicines.  Any blood disorders you have.  Any surgeries you have had.  Any medical conditions you have. What happens during the test? First, your blood glucose will be measured. This is referred to as your fasting blood glucose, since you fasted before the test. Then, you will drink a glucose solution that contains a certain amount of glucose. Your blood glucose will be measured again 1, 2, and 3 hours after drinking the solution. This test takes about 3 hours to complete. You will need to stay at the testing location during this time. During the testing period:  Do not eat or drink anything other than the glucose solution.  Do not exercise.  Do not use any products that contain nicotine or tobacco, such as cigarettes and e-cigarettes. If you need help stopping, ask your health care provider. The testing procedure may vary among health care providers and hospitals. How are the results reported? Your results will be reported as milligrams of glucose per deciliter of blood (mg/dL) or millimoles per liter (mmol/L). Your health care provider will compare your results to normal ranges that were established after testing a large group of people (reference ranges). Reference ranges may vary among labs and hospitals. For this test, common reference ranges are:  Fasting: less than 95-105 mg/dL (5.3-5.8 mmol/L).  1 hour   after drinking glucose: less than 180-190 mg/dL (10.0-10.5 mmol/L).  2 hours after drinking glucose: less than 155-165 mg/dL (8.6-9.2 mmol/L).  3 hours after drinking glucose: 140-145 mg/dL (7.8-8.1 mmol/L). What do the results mean? Results within reference ranges are considered normal, meaning that your glucose levels are well-controlled. If two or more of your blood glucose levels are high, you may be diagnosed with gestational diabetes. If only one  level is high, your health care provider may suggest repeat testing or other tests to confirm a diagnosis. Talk with your health care provider about what your results mean. Questions to ask your health care provider Ask your health care provider, or the department that is doing the test:  When will my results be ready?  How will I get my results?  What are my treatment options?  What other tests do I need?  What are my next steps? Summary  The glucose tolerance test (GTT) is one of several tests used to diagnose diabetes that develops during pregnancy (gestational diabetes mellitus). Gestational diabetes is a temporary form of diabetes that some women develop during pregnancy.  You may have the GTT test after having a 1-hour glucose screening test if the results from that test indicate that you may have gestational diabetes. You may also have this test if you have any symptoms or risk factors for gestational diabetes.  Talk with your health care provider about what your results mean. This information is not intended to replace advice given to you by your health care provider. Make sure you discuss any questions you have with your health care provider. Document Revised: 08/09/2018 Document Reviewed: 11/28/2016 Elsevier Patient Education  2020 Elsevier Inc.  

## 2019-05-29 LAB — CBC
Hematocrit: 38.7 % (ref 34.0–46.6)
Hemoglobin: 13 g/dL (ref 11.1–15.9)
MCH: 31.3 pg (ref 26.6–33.0)
MCHC: 33.6 g/dL (ref 31.5–35.7)
MCV: 93 fL (ref 79–97)
Platelets: 282 10*3/uL (ref 150–450)
RBC: 4.16 x10E6/uL (ref 3.77–5.28)
RDW: 12 % (ref 11.7–15.4)
WBC: 10.3 10*3/uL (ref 3.4–10.8)

## 2019-05-29 LAB — GLUCOSE, 1 HOUR GESTATIONAL: Gestational Diabetes Screen: 63 mg/dL — ABNORMAL LOW (ref 65–139)

## 2019-05-29 LAB — RPR: RPR Ser Ql: NONREACTIVE

## 2019-06-14 ENCOUNTER — Other Ambulatory Visit: Payer: Self-pay

## 2019-06-14 ENCOUNTER — Ambulatory Visit (INDEPENDENT_AMBULATORY_CARE_PROVIDER_SITE_OTHER): Payer: Medicaid Other | Admitting: Certified Nurse Midwife

## 2019-06-14 ENCOUNTER — Encounter: Payer: Self-pay | Admitting: Certified Nurse Midwife

## 2019-06-14 VITALS — BP 94/57 | HR 77 | Wt 183.5 lb

## 2019-06-14 DIAGNOSIS — Z3403 Encounter for supervision of normal first pregnancy, third trimester: Secondary | ICD-10-CM

## 2019-06-14 LAB — POCT URINALYSIS DIPSTICK OB
Bilirubin, UA: NEGATIVE
Blood, UA: NEGATIVE
Glucose, UA: NEGATIVE
Ketones, UA: NEGATIVE
Leukocytes, UA: NEGATIVE
Nitrite, UA: NEGATIVE
POC,PROTEIN,UA: NEGATIVE
Spec Grav, UA: 1.025 (ref 1.010–1.025)
Urobilinogen, UA: 0.2 E.U./dL
pH, UA: 5 (ref 5.0–8.0)

## 2019-06-14 NOTE — Addendum Note (Signed)
Addended by: Brooke Dare on: 06/14/2019 11:24 AM   Modules accepted: Orders

## 2019-06-14 NOTE — Patient Instructions (Signed)
[facility]  [facility]  [address], [place], [place] [address]  Phone: [phone]  [facility] (second location)  [address] [place], [place] [address]  Phone: [phone]  [facility] ([place]) [address], [place], [place] [address] Phone: [phone]  [facility]  [address] [place], [place] [address]  Phone: [phone] 

## 2019-06-14 NOTE — Progress Notes (Signed)
ROB doing well. Feels good movement. Birth plan reviewed. Copy scanned into chart. Plans IV and or Epidural as needed. Want skin to skin 1st hour, use of intermittent monitoring and shower, plans mucic, heat therapy, and frequent movement for pain management. List of peds given. She is undecided about birth control at this time. Has round ligament pain, self help measures reviewed. Follow up 2 wks with Marcelino Duster.

## 2019-06-25 ENCOUNTER — Ambulatory Visit
Admission: EM | Admit: 2019-06-25 | Discharge: 2019-06-25 | Disposition: A | Discharge status: Home / Self Care | Payer: Medicaid Other | Attending: Internal Medicine | Admitting: Internal Medicine

## 2019-06-25 ENCOUNTER — Other Ambulatory Visit: Payer: Self-pay

## 2019-06-25 ENCOUNTER — Encounter: Payer: Self-pay | Admitting: Emergency Medicine

## 2019-06-25 DIAGNOSIS — B029 Zoster without complications: Secondary | ICD-10-CM | POA: Diagnosis not present

## 2019-06-25 DIAGNOSIS — H60392 Other infective otitis externa, left ear: Secondary | ICD-10-CM

## 2019-06-25 MED ORDER — LIDOCAINE 5 % EX OINT: 1.0000 "application " | TOPICAL_OINTMENT | CUTANEOUS | 0 refills | Status: DC | PRN

## 2019-06-25 MED ORDER — HYDROCORTISONE-ACETIC ACID 1-2 % OT SOLN: 3.0000 [drp] | Freq: Three times a day (TID) | OTIC | 0 refills | Status: DC

## 2019-06-25 NOTE — ED Provider Notes (Signed)
[facility]    CSN: [id number] Arrival date & time: 06/25/19  1551      History   Chief Complaint Chief Complaint  Patient presents with  . Rash    APPT    HPI Alicia Stephens is a 29 y.o. female currently [redacted] weeks pregnant comes to urgent care with complaints of painful rash over the right side of the abdomen.  Symptoms started 6 days ago with a rash on the lateral aspect of the abdomen.  Rash was tender to touch and spread to her back as well as the other part of the abdomen.  She denies any fever or chills.  The rash was painful to touch.  Most of the rash seems to have dried up over the past few days and the last crop of rashes all was close to the umbilicus-last crop of rash was 3 days ago.  No nausea or vomiting.  No sick contacts.  Patient denies knowledge of varicella infection.  No other areas has rash.   Patient also complains of left ear pain which started a few days ago.  Patient cleans the ears regularly with a Q-tip.  She says pain is constant and of mild severity.  No ear discharge or hearing difficulty.  No tinnitus.  No fever or chills.  HPI  Past Medical History:  Diagnosis Date  . Breast mass     There are no problems to display for this patient.   History reviewed. No pertinent surgical history.  OB History    Gravida  1   Para      Term      Preterm      AB      Living        SAB      TAB      Ectopic      Multiple      Live Births               Home Medications    Prior to Admission medications   Medication Sig Start Date End Date Taking? Authorizing Provider  famotidine (PEPCID) 10 MG tablet Take 10 mg by mouth 2 (two) times daily.   Yes [provider]  Prenat-Fe Poly-Methfol-FA-DHA (VITAFOL FE+) 90-0.6-0.4-200 MG CAPS Take 1 tablet by mouth daily. 05/28/19  Yes Doreene Burke, CNM  acetic acid-hydrocortisone (VOSOL-HC) OTIC solution Place 3 drops into the left ear 3 (three) times daily for 3 days.  06/25/19 06/28/19  Merrilee Jansky, MD  lidocaine (XYLOCAINE) 5 % ointment Apply 1 application topically as needed. 06/25/19   LampteyBritta Mccreedy, MD    Family History Family History  Problem Relation Age of Onset  . Breast cancer Paternal Grandmother   . Diabetes Other     Social History Social History   Tobacco Use  . Smoking status: Never Smoker  . Smokeless tobacco: Never Used  Substance Use Topics  . Alcohol use: Not Currently  . Drug use: Not Currently    Types: Marijuana     Allergies   Patient has no known allergies.   Review of Systems Review of Systems  Constitutional: Negative.  Negative for activity change, chills, fatigue and fever.  HENT: Positive for ear pain. Negative for ear discharge and trouble swallowing.   Respiratory: Negative for chest tightness and shortness of breath.   Gastrointestinal: Negative for abdominal pain, diarrhea, nausea and vomiting.  Genitourinary: Negative for dysuria, vaginal bleeding, vaginal discharge and vaginal pain.  Musculoskeletal:  Negative for arthralgias, gait problem and joint swelling.  Skin: Positive for rash. Negative for color change.  Neurological: Negative for dizziness, numbness and headaches.     Physical Exam Triage Vital Signs ED Triage Vitals  Enc Vitals Group     BP 06/25/19 1611 (!) 108/54     Pulse Rate 06/25/19 1611 70     Resp 06/25/19 1611 18     Temp 06/25/19 1611 98.2 F (36.8 C)     Temp Source 06/25/19 1611 Oral     SpO2 06/25/19 1611 99 %     Weight 06/25/19 1609 180 lb (81.6 kg)     Height 06/25/19 1609 5\' 9"  (1.753 m)     Head Circumference --      Peak Flow --      Pain Score 06/25/19 1608 0     Pain Loc --      Pain Edu? --      Excl. in GC? --    No data found.  Updated Vital Signs BP (!) 108/54 (BP Location: Right Arm)   Pulse 70   Temp 98.2 F (36.8 C) (Oral)   Resp 18   Ht 5\' 9"  (1.753 m)   Wt 81.6 kg   LMP 11/14/2018   SpO2 99%   BMI 26.58 kg/m   Visual  Acuity Right Eye Distance:   Left Eye Distance:   Bilateral Distance:    Right Eye Near:   Left Eye Near:    Bilateral Near:     Physical Exam Constitutional:      General: She is not in acute distress.    Appearance: Normal appearance. She is not ill-appearing, toxic-appearing or diaphoretic.  HENT:     Right Ear: Tympanic membrane normal.     Left Ear: Tympanic membrane normal.     Ears:     Comments: Erythematous external ear canal on the right ear.  No discharge.  No TM perforation bilaterally Eyes:     Extraocular Movements: Extraocular movements intact.     Conjunctiva/sclera: Conjunctivae normal.     Pupils: Pupils are equal, round, and reactive to light.  Cardiovascular:     Rate and Rhythm: Normal rate and regular rhythm.  Pulmonary:     Effort: Pulmonary effort is normal. No respiratory distress.     Breath sounds: Normal breath sounds. No stridor.  Musculoskeletal:        General: Normal range of motion.  Skin:    Capillary Refill: Capillary refill takes less than 2 seconds.  Neurological:     Mental Status: She is alert.          UC Treatments / Results  Labs (all labs ordered are listed, but only abnormal results are displayed) Labs Reviewed - No data to display  EKG   Radiology No results found.  Procedures Procedures (including critical care time)  Medications Ordered in UC Medications - No data to display  Initial Impression / Assessment and Plan / UC Course  I have reviewed the triage vital signs and the nursing notes.  Pertinent labs & imaging results that were available during my care of the patient were reviewed by me and considered in my medical decision making (see chart for details).     1.  Herpes zoster without complication: Patient symptoms started 6 days ago and lesions have dried up to a large extent It is not clear that there is benefit to treatment with antiviral agents Patient is advised to reach out to  her OB/GYN in  regarding Valtrex use until delivery.  Since the rashes on the abdomen and is largely healed up now, I am not sure the benefit of starting Valtrex at this point.  I discussed that with the patient and she verbalizes understanding.  She agrees to follow-up with the OB/GYN regarding Valtrex use until delivery  2.  Otitis externa of the left ear: VoSol otic solution twice daily Return precautions given to patient.  Final Clinical Impressions(s) / UC Diagnoses   Final diagnoses:  Herpes zoster without complication  Infective otitis externa of left ear     Discharge Instructions     No antiviral at this time  Please call your OB/Gyn and ask about the need for valtrex.    ED Prescriptions    Medication Sig Dispense Auth. Provider   lidocaine (XYLOCAINE) 5 % ointment Apply 1 application topically as needed. 35.44 g Chase Picket, MD   acetic acid-hydrocortisone (VOSOL-HC) OTIC solution Place 3 drops into the left ear 3 (three) times daily for 3 days. 10 mL Meloni Hinz, Myrene Galas, MD     PDMP not reviewed this encounter.   Chase Picket, MD 06/25/19 1800

## 2019-06-25 NOTE — Discharge Instructions (Signed)
No antiviral at this time  Please call your OB/Gyn and ask about the need for valtrex.

## 2019-06-25 NOTE — ED Triage Notes (Addendum)
Patient c/o rash on the right side of her back and abdomen that started 5 days ago. She states the area itches.  Patient also reports left ear pain x 2 days.

## 2019-06-28 ENCOUNTER — Other Ambulatory Visit: Payer: Self-pay

## 2019-06-28 ENCOUNTER — Ambulatory Visit (INDEPENDENT_AMBULATORY_CARE_PROVIDER_SITE_OTHER): Payer: Medicaid Other | Admitting: Certified Nurse Midwife

## 2019-06-28 VITALS — BP 98/61 | HR 80 | Wt 189.4 lb

## 2019-06-28 DIAGNOSIS — Z3493 Encounter for supervision of normal pregnancy, unspecified, third trimester: Secondary | ICD-10-CM

## 2019-06-28 DIAGNOSIS — B029 Zoster without complications: Secondary | ICD-10-CM

## 2019-06-28 DIAGNOSIS — Z3A32 32 weeks gestation of pregnancy: Secondary | ICD-10-CM

## 2019-06-28 DIAGNOSIS — Z3483 Encounter for supervision of other normal pregnancy, third trimester: Secondary | ICD-10-CM

## 2019-06-28 LAB — POCT URINALYSIS DIPSTICK OB
Bilirubin, UA: NEGATIVE
Blood, UA: NEGATIVE
Glucose, UA: NEGATIVE
Ketones, UA: NEGATIVE
Nitrite, UA: NEGATIVE
POC,PROTEIN,UA: NEGATIVE
Spec Grav, UA: 1.01 (ref 1.010–1.025)
Urobilinogen, UA: 0.2 E.U./dL
pH, UA: 7 (ref 5.0–8.0)

## 2019-06-28 MED ORDER — ACYCLOVIR 800 MG PO TABS: 800.0000 mg | ORAL_TABLET | Freq: Every day | ORAL | 0 refills | Status: AC

## 2019-06-28 NOTE — Patient Instructions (Addendum)
Fetal Movement Counts Patient Name: ________________________________________________ Patient Due Date: ____________________ What is a fetal movement count?  A fetal movement count is the number of times that you feel your baby move during a certain amount of time. This may also be called a fetal kick count. A fetal movement count is recommended for every pregnant woman. You may be asked to start counting fetal movements as early as week 28 of your pregnancy. Pay attention to when your baby is most active. You may notice your baby's sleep and wake cycles. You may also notice things that make your baby move more. You should do a fetal movement count:  When your baby is normally most active.  At the same time each day. A good time to count movements is while you are resting, after having something to eat and drink. How do I count fetal movements? 1. Find a quiet, comfortable area. Sit, or lie down on your side. 2. Write down the date, the start time and stop time, and the number of movements that you felt between those two times. Take this information with you to your health care visits. 3. Write down your start time when you feel the first movement. 4. Count kicks, flutters, swishes, rolls, and jabs. You should feel at least 10 movements. 5. You may stop counting after you have felt 10 movements, or if you have been counting for 2 hours. Write down the stop time. 6. If you do not feel 10 movements in 2 hours, contact your health care provider for further instructions. Your health care provider may want to do additional tests to assess your baby's well-being. Contact a health care provider if:  You feel fewer than 10 movements in 2 hours.  Your baby is not moving like he or she usually does. Date: ____________ Start time: ____________ Stop time: ____________ Movements: ____________ Date: ____________ Start time: ____________ Stop time: ____________ Movements: ____________ Date: ____________  Start time: ____________ Stop time: ____________ Movements: ____________ Date: ____________ Start time: ____________ Stop time: ____________ Movements: ____________ Date: ____________ Start time: ____________ Stop time: ____________ Movements: ____________ Date: ____________ Start time: ____________ Stop time: ____________ Movements: ____________ Date: ____________ Start time: ____________ Stop time: ____________ Movements: ____________ Date: ____________ Start time: ____________ Stop time: ____________ Movements: ____________ Date: ____________ Start time: ____________ Stop time: ____________ Movements: ____________ This information is not intended to replace advice given to you by your health care provider. Make sure you discuss any questions you have with your health care provider. Document Revised: 12/06/2018 Document Reviewed: 12/06/2018 Elsevier Patient Education  2020 [employer]  Shingles  Shingles is an infection. It gives you a painful skin rash and blisters that have fluid in them. Shingles is caused by the same germ (virus) that causes chickenpox. Shingles only happens in people who:  Have had chickenpox.  Have been given a shot of medicine (vaccine) to protect against chickenpox. Shingles is rare in this group. The first symptoms of shingles may be itching, tingling, or pain in an area on your skin. A rash will show on your skin a few days or weeks later. The rash is likely to be on one side of your body. The rash usually has a shape like a belt or a band. Over time, the rash turns into fluid-filled blisters. The blisters will break open, change into scabs, and dry up. Medicines may:  Help with pain and itching.  Help you get better sooner.  Help to prevent long-term problems. Follow these instructions at  home: Medicines  Take over-the-counter and prescription medicines only as told by your doctor.  Put on an anti-itch cream or numbing cream where you have a rash,  blisters, or scabs. Do this as told by your doctor. Helping with itching and discomfort   Put cold, wet cloths (cold compresses) on the area of the rash or blisters as told by your doctor.  Cool baths can help you feel better. Try adding baking soda or dry oatmeal to the water to lessen itching. Do not bathe in hot water. Blister and rash care  Keep your rash covered with a loose bandage (dressing).  Wear loose clothing that does not rub on your rash.  Keep your rash and blisters clean. To do this, wash the area with mild soap and cool water as told by your doctor.  Check your rash every day for signs of infection. Check for: ? More redness, swelling, or pain. ? Fluid or blood. ? Warmth. ? Pus or a bad smell.  Do not scratch your rash. Do not pick at your blisters. To help you to not scratch: ? Keep your fingernails clean and cut short. ? Wear gloves or mittens when you sleep, if scratching is a problem. General instructions  Rest as told by your doctor.  Keep all follow-up visits as told by your doctor. This is important.  Wash your hands often with soap and water. If soap and water are not available, use hand sanitizer. Doing this lowers your chance of getting a skin infection caused by germs (bacteria).  Your infection can cause chickenpox in people who have never had chickenpox or never got a shot of chickenpox vaccine. If you have blisters that did not change into scabs yet, try not to touch other people or be around other people, especially: ? Babies. ? Pregnant women. ? Children who have areas of red, itchy, or rough skin (eczema). ? Very old people who have transplants. ? People who have a long-term (chronic) sickness, like cancer or AIDS. Contact a doctor if:  Your pain does not get better with medicine.  Your pain does not get better after the rash heals.  You have any signs of infection in the rash area. These signs include: ? More redness, swelling, or pain  around the rash. ? Fluid or blood coming from the rash. ? The rash area feeling warm to the touch. ? Pus or a bad smell coming from the rash. Get help right away if:  The rash is on your face or nose.  You have pain in your face or pain by your eye.  You lose feeling on one side of your face.  You have trouble seeing.  You have ear pain, or you have ringing in your ear.  You have a loss of taste.  Your condition gets worse. Summary  Shingles gives you a painful skin rash and blisters that have fluid in them.  Shingles is an infection. It is caused by the same germ (virus) that causes chickenpox.  Keep your rash covered with a loose bandage (dressing). Wear loose clothing that does not rub on your rash.  If you have blisters that did not change into scabs yet, try not to touch other people or be around people. This information is not intended to replace advice given to you by your health care provider. Make sure you discuss any questions you have with your health care provider. Document Revised: 08/10/2018 Document Reviewed: 12/21/2016 Elsevier Patient Education  2020 Elsevier  Inc. Acyclovir tablets or capsules What is this medicine? ACYCLOVIR (ay SYE kloe veer) is an antiviral medicine. It is used to treat or prevent infections caused by certain kinds of viruses. Examples of these infections include herpes and shingles. This medicine will not cure herpes. This medicine may be used for other purposes; ask your health care provider or pharmacist if you have questions. COMMON BRAND NAME(S): Zovirax What should I tell my health care provider before I take this medicine? They need to know if you have any of these conditions:  kidney disease  an unusual or allergic reaction to acyclovir, ganciclovir, valacyclovir, other medicines, foods, dyes, or preservatives  pregnant or trying to get pregnant  breast-feeding How should I use this medicine? Take this medicine by mouth with  a glass of water. Follow the directions on the prescription label. You can take it with or without food. Take your medicine at regular intervals. Do not take your medicine more often than directed. Take all of your medicine as directed even if you think your are better. Do not skip doses or stop your medicine early. Talk to your pediatrician regarding the use of this medicine in children. While this drug may be prescribed for selected conditions, precautions do apply. Overdosage: If you think you have taken too much of this medicine contact a poison control center or emergency room at once. NOTE: This medicine is only for you. Do not share this medicine with others. What if I miss a dose? If you miss a dose, take it as soon as you can. If it is almost time for your next dose, take only that dose. Do not take double or extra doses. What may interact with this medicine? Do not take this medicine with any of the following medications:  cidofovir This medicine may also interact with the following medications:  adefovir  amphotericin B  certain antibiotics like amikacin, gentamicin, tobramycin, vancomycin  cimetidine  cisplatin  colistin  cyclosporine  foscarnet  lithium  methotrexate  probenecid  tacrolimus This list may not describe all possible interactions. Give your health care provider a list of all the medicines, herbs, non-prescription drugs, or dietary supplements you use. Also tell them if you smoke, drink alcohol, or use illegal drugs. Some items may interact with your medicine. What should I watch for while using this medicine? Tell your doctor or health care professional if your symptoms do not improve. This medicine works best when started very early in the course of an infection. Begin treatment at the first signs of infection. Drink 6 to 8 glasses of water or fluids every day while you are taking this medicine. This will help prevent side effects. You can still pass  chickenpox, shingles, or herpes to another person even while you are taking this medicine. Avoid contact with others as directed. Genital herpes is a sexually transmitted disease. Talk to your doctor about how to stop the spread of infection. What side effects may I notice from receiving this medicine? Side effects that you should report to your doctor or health care professional as soon as possible:  allergic reactions like skin rash, itching or hives, swelling of the face, lips, or tongue  chest pain  confusion, hallucinations, tremor  dark urine  increased sensitivity to the sun  redness, blistering, peeling or loosening of the skin, including inside the mouth  seizures  trouble passing urine or change in the amount of urine  unusual bleeding or bruising, or pinpoint red spots on  the skin  unusually weak or tired  yellowing of the eyes or skin Side effects that usually do not require medical attention (report to your doctor or health care professional if they continue or are bothersome):  diarrhea  fever  headache  nausea, vomiting  stomach upset This list may not describe all possible side effects. Call your doctor for medical advice about side effects. You may report side effects to FDA at 1-800-FDA-1088. Where should I keep my medicine? Keep out of the reach of children. Store at room temperature between 15 and 25 degrees C (59 and 77 degrees F). Throw away any unused medicine after the expiration date. NOTE: This sheet is a summary. It may not cover all possible information. If you have questions about this medicine, talk to your doctor, pharmacist, or health care provider.  2020 Elsevier/Gold Standard (2018-05-15 12:34:25)

## 2019-06-28 NOTE — Progress Notes (Signed)
ROB-Patient had shingles outbreak 1 week ago, went to Urgent Care and was prescribed lidocaine gel.  Patient feels better, still having some itching.

## 2019-06-29 NOTE — Progress Notes (Signed)
ROB-Seen in UC last week for shingles outbreak. Using lidocaine gel and feeling better. Education regarding shingles in pregnancy. Rx Acyclovir, see orders. Anticipatory guidance regarding course of prenatal care. Reviewed red flag symptoms and when to call. RTC x 2 weeks for ROB or sooner if needed.

## 2019-07-10 ENCOUNTER — Ambulatory Visit (INDEPENDENT_AMBULATORY_CARE_PROVIDER_SITE_OTHER): Payer: Medicaid Other | Admitting: Certified Nurse Midwife

## 2019-07-10 ENCOUNTER — Other Ambulatory Visit: Payer: Self-pay

## 2019-07-10 VITALS — BP 107/63 | HR 90 | Wt 190.3 lb

## 2019-07-10 DIAGNOSIS — Z3493 Encounter for supervision of normal pregnancy, unspecified, third trimester: Secondary | ICD-10-CM

## 2019-07-10 LAB — POCT URINALYSIS DIPSTICK OB
Bilirubin, UA: NEGATIVE
Blood, UA: NEGATIVE
Glucose, UA: NEGATIVE
Ketones, UA: NEGATIVE
Leukocytes, UA: NEGATIVE
Nitrite, UA: NEGATIVE
POC,PROTEIN,UA: NEGATIVE
Spec Grav, UA: >=1.03 — AB (ref 1.010–1.025)
Urobilinogen, UA: 0.2 E.U./dL
pH, UA: 6 (ref 5.0–8.0)

## 2019-07-10 NOTE — Progress Notes (Signed)
ROB doing well. Feels good movement. Discussed GBS testing and SVE at next visit . She verbalizes and agrees. Follow up 2 wk with Marcelino Duster.   Doreene Burke, CNM

## 2019-07-10 NOTE — Patient Instructions (Signed)
Group B Streptococcus Infection During Pregnancy °Group B Streptococcus (GBS) is a type of bacteria that is often found in healthy people. It is commonly found in the rectum, vagina, and intestines. In people who are healthy and not pregnant, the bacteria rarely cause serious illness or complications. However, women who test positive for GBS during pregnancy can pass the bacteria to the baby during childbirth. This can cause serious infection in the baby after birth. °Women with GBS may also have infections during their pregnancy or soon after childbirth. The infections include urinary tract infections (UTIs) or infections of the uterus. GBS also increases a woman's risk of complications during pregnancy, such as early labor or delivery, miscarriage, or stillbirth. Routine testing for GBS is recommended for all pregnant women. °What are the causes? °This condition is caused by bacteria called Streptococcus agalactiae. °What increases the risk? °You may have a higher risk for GBS infection during pregnancy if you had one during a past pregnancy. °What are the signs or symptoms? °In most cases, GBS infection does not cause symptoms in pregnant women. If symptoms exist, they may include: °· Labor that starts before the 37th week of pregnancy. °· A UTI or bladder infection. This may cause a fever, frequent urination, or pain and burning during urination. °· Fever during labor. There can also be a rapid heartbeat in the mother or baby. °Rare but serious symptoms of a GBS infection in women include: °· Blood infection (septicemia). This may cause fever, chills, or confusion. °· Lung infection (pneumonia). This may cause fever, chills, cough, rapid breathing, chest pain, or difficulty breathing. °· Bone, joint, skin, or soft tissue infection. °How is this diagnosed? °You may be screened for GBS between week 35 and week 37 of pregnancy. If you have symptoms of preterm labor, you may be screened earlier. This condition is  diagnosed based on lab test results from: °· A swab of fluid from the vagina and rectum. °· A urine sample. °How is this treated? °This condition is treated with antibiotic medicine. Antibiotic medicine may be given: °· To you when you go into labor, or as soon as your water breaks. The medicines will continue until after you give birth. If you are having a cesarean delivery, you do not need antibiotics unless your water has broken. °· To your baby, if he or she requires treatment. Your health care provider will check your baby to decide if he or she needs antibiotics to prevent a serious infection. °Follow these instructions at home: °· Take over-the-counter and prescription medicines only as told by your health care provider. °· Take your antibiotic medicine as told by your health care provider. Do not stop taking the antibiotic even if you start to feel better. °· Keep all pre-birth (prenatal) visits and follow-up visits as told by your health care provider. This is important. °Contact a health care provider if: °· You have pain or burning when you urinate. °· You have to urinate more often than usual. °· You have a fever or chills. °· You develop a bad-smelling vaginal discharge. °Get help right away if: °· Your water breaks. °· You go into labor. °· You have severe pain in your abdomen. °· You have difficulty breathing. °· You have chest pain. °These symptoms may represent a serious problem that is an emergency. Do not wait to see if the symptoms will go away. Get medical help right away. Call your local emergency services (911 in the [place] Do not drive yourself to   the hospital. °Summary °· GBS is a type of bacteria that is common in healthy people. °· During pregnancy, colonization with GBS can cause serious complications for you or your baby. °· Your health care provider will screen you between 35 and 37 weeks of pregnancy to determine if you are colonized with GBS. °· If you are colonized with GBS during  pregnancy, your health care provider will recommend antibiotics through an IV during labor. °· After delivery, your baby will be evaluated for complications related to potential GBS infection and may require antibiotics to prevent a serious infection. °This information is not intended to replace advice given to you by your health care provider. Make sure you discuss any questions you have with your health care provider. °Document Revised: 11/12/2018 Document Reviewed: 11/12/2018 °Elsevier Patient Education © 2020 [employer] Inc. ° °

## 2019-07-25 ENCOUNTER — Other Ambulatory Visit: Payer: Self-pay

## 2019-07-25 ENCOUNTER — Ambulatory Visit (INDEPENDENT_AMBULATORY_CARE_PROVIDER_SITE_OTHER): Payer: Medicaid Other | Admitting: Certified Nurse Midwife

## 2019-07-25 VITALS — BP 110/69 | HR 76 | Wt 192.4 lb

## 2019-07-25 DIAGNOSIS — Z3493 Encounter for supervision of normal pregnancy, unspecified, third trimester: Secondary | ICD-10-CM | POA: Diagnosis not present

## 2019-07-25 LAB — POCT URINALYSIS DIPSTICK OB
Bilirubin, UA: NEGATIVE
Blood, UA: NEGATIVE
Glucose, UA: NEGATIVE
Ketones, UA: NEGATIVE
Leukocytes, UA: NEGATIVE
Nitrite, UA: NEGATIVE
POC,PROTEIN,UA: NEGATIVE
Spec Grav, UA: 1.01 (ref 1.010–1.025)
Urobilinogen, UA: 0.2 E.U./dL
pH, UA: 5 (ref 5.0–8.0)

## 2019-07-25 LAB — OB RESULTS CONSOLE GC/CHLAMYDIA: Gonorrhea: NEGATIVE

## 2019-07-25 NOTE — Progress Notes (Signed)
ROB-Doing well. 36 week cultures collected today, see orders. Declined SVE. COVID restrictions discussed. Pre-labor checklist and herbal prep handout provided. Anticipatory guidance regarding course of prenatal care. Reviewed red flag symptoms and when to call. RTC x 1 week for ROB or sooner if needed.

## 2019-07-25 NOTE — Patient Instructions (Signed)
Fetal Movement Counts Patient Name: ________________________________________________ Patient Due Date: ____________________ What is a fetal movement count?  A fetal movement count is the number of times that you feel your baby move during a certain amount of time. This may also be called a fetal kick count. A fetal movement count is recommended for every pregnant woman. You may be asked to start counting fetal movements as early as week 28 of your pregnancy. Pay attention to when your baby is most active. You may notice your baby's sleep and wake cycles. You may also notice things that make your baby move more. You should do a fetal movement count:  When your baby is normally most active.  At the same time each day. A good time to count movements is while you are resting, after having something to eat and drink. How do I count fetal movements? 1. Find a quiet, comfortable area. Sit, or lie down on your side. 2. Write down the date, the start time and stop time, and the number of movements that you felt between those two times. Take this information with you to your health care visits. 3. Write down your start time when you feel the first movement. 4. Count kicks, flutters, swishes, rolls, and jabs. You should feel at least 10 movements. 5. You may stop counting after you have felt 10 movements, or if you have been counting for 2 hours. Write down the stop time. 6. If you do not feel 10 movements in 2 hours, contact your health care provider for further instructions. Your health care provider may want to do additional tests to assess your baby's well-being. Contact a health care provider if:  You feel fewer than 10 movements in 2 hours.  Your baby is not moving like he or she usually does. Date: ____________ Start time: ____________ Stop time: ____________ Movements: ____________ Date: ____________ Start time: ____________ Stop time: ____________ Movements: ____________ Date: ____________  Start time: ____________ Stop time: ____________ Movements: ____________ Date: ____________ Start time: ____________ Stop time: ____________ Movements: ____________ Date: ____________ Start time: ____________ Stop time: ____________ Movements: ____________ Date: ____________ Start time: ____________ Stop time: ____________ Movements: ____________ Date: ____________ Start time: ____________ Stop time: ____________ Movements: ____________ Date: ____________ Start time: ____________ Stop time: ____________ Movements: ____________ Date: ____________ Start time: ____________ Stop time: ____________ Movements: ____________ This information is not intended to replace advice given to you by your health care provider. Make sure you discuss any questions you have with your health care provider. Document Revised: 12/06/2018 Document Reviewed: 12/06/2018 Elsevier Patient Education  2020 [employer].  

## 2019-07-27 LAB — STREP GP B NAA: Strep Gp B NAA: NEGATIVE

## 2019-07-28 LAB — GC/CHLAMYDIA PROBE AMP
Chlamydia trachomatis, NAA: NEGATIVE
Neisseria Gonorrhoeae by PCR: NEGATIVE

## 2019-07-31 ENCOUNTER — Other Ambulatory Visit: Payer: Self-pay

## 2019-07-31 ENCOUNTER — Ambulatory Visit (INDEPENDENT_AMBULATORY_CARE_PROVIDER_SITE_OTHER): Payer: Medicaid Other | Admitting: Certified Nurse Midwife

## 2019-07-31 ENCOUNTER — Encounter: Payer: Self-pay | Admitting: Certified Nurse Midwife

## 2019-07-31 VITALS — BP 109/59 | HR 90 | Wt 193.6 lb

## 2019-07-31 DIAGNOSIS — Z3493 Encounter for supervision of normal pregnancy, unspecified, third trimester: Secondary | ICD-10-CM

## 2019-07-31 LAB — POCT URINALYSIS DIPSTICK OB
Bilirubin, UA: NEGATIVE
Blood, UA: NEGATIVE
Glucose, UA: NEGATIVE
Ketones, UA: NEGATIVE
Leukocytes, UA: NEGATIVE
Nitrite, UA: NEGATIVE
POC,PROTEIN,UA: NEGATIVE
Spec Grav, UA: 1.015 (ref 1.010–1.025)
Urobilinogen, UA: 0.2 E.U./dL
pH, UA: 5 (ref 5.0–8.0)

## 2019-07-31 NOTE — Patient Instructions (Signed)
Braxton Hicks Contractions °Contractions of the uterus can occur throughout pregnancy, but they are not always a sign that you are in labor. You may have practice contractions called Braxton Hicks contractions. These false labor contractions are sometimes confused with true labor. °What are Braxton Hicks contractions? °Braxton Hicks contractions are tightening movements that occur in the muscles of the uterus before labor. Unlike true labor contractions, these contractions do not result in opening (dilation) and thinning of the cervix. Toward the end of pregnancy (32-34 weeks), Braxton Hicks contractions can happen more often and may become stronger. These contractions are sometimes difficult to tell apart from true labor because they can be very uncomfortable. You should not feel embarrassed if you go to the hospital with false labor. °Sometimes, the only way to tell if you are in true labor is for your health care provider to look for changes in the cervix. The health care provider will do a physical exam and may monitor your contractions. If you are not in true labor, the exam should show that your cervix is not dilating and your water has not broken. °If there are no other health problems associated with your pregnancy, it is completely safe for you to be sent home with false labor. You may continue to have Braxton Hicks contractions until you go into true labor. °How to tell the difference between true labor and false labor °True labor °· Contractions last 30-70 seconds. °· Contractions become very regular. °· Discomfort is usually felt in the top of the uterus, and it spreads to the lower abdomen and low back. °· Contractions do not go away with walking. °· Contractions usually become more intense and increase in frequency. °· The cervix dilates and gets thinner. °False labor °· Contractions are usually shorter and not as strong as true labor contractions. °· Contractions are usually irregular. °· Contractions  are often felt in the front of the lower abdomen and in the groin. °· Contractions may go away when you walk around or change positions while lying down. °· Contractions get weaker and are shorter-lasting as time goes on. °· The cervix usually does not dilate or become thin. °Follow these instructions at home: ° °· Take over-the-counter and prescription medicines only as told by your health care provider. °· Keep up with your usual exercises and follow other instructions from your health care provider. °· Eat and drink lightly if you think you are going into labor. °· If Braxton Hicks contractions are making you uncomfortable: °? Change your position from lying down or resting to walking, or change from walking to resting. °? Sit and rest in a tub of warm water. °? Drink enough fluid to keep your urine pale yellow. Dehydration may cause these contractions. °? Do slow and deep breathing several times an hour. °· Keep all follow-up prenatal visits as told by your health care provider. This is important. °Contact a health care provider if: °· You have a fever. °· You have continuous pain in your abdomen. °Get help right away if: °· Your contractions become stronger, more regular, and closer together. °· You have fluid leaking or gushing from your vagina. °· You pass blood-tinged mucus (bloody show). °· You have bleeding from your vagina. °· You have low back pain that you never had before. °· You feel your baby’s head pushing down and causing pelvic pressure. °· Your baby is not moving inside you as much as it used to. °Summary °· Contractions that occur before labor are   called Braxton Hicks contractions, false labor, or practice contractions. °· Braxton Hicks contractions are usually shorter, weaker, farther apart, and less regular than true labor contractions. True labor contractions usually become progressively stronger and regular, and they become more frequent. °· Manage discomfort from Braxton Hicks contractions  by changing position, resting in a warm bath, drinking plenty of water, or practicing deep breathing. °This information is not intended to replace advice given to you by your health care provider. Make sure you discuss any questions you have with your health care provider. °Document Revised: 03/31/2017 Document Reviewed: 09/01/2016 °Elsevier Patient Education © 2020 Elsevier Inc. ° °

## 2019-07-31 NOTE — Progress Notes (Signed)
ROB doing well. Feels good movement. Labor precautions reviewed. Declines SVE, will do next visit. She is following herbal prep handout to help ripen cervix. Follow up 1 wk with Marcelino Duster.   Doreene Burke, CNM

## 2019-08-05 ENCOUNTER — Encounter: Payer: Medicaid Other | Admitting: Certified Nurse Midwife

## 2019-08-07 ENCOUNTER — Ambulatory Visit (INDEPENDENT_AMBULATORY_CARE_PROVIDER_SITE_OTHER): Payer: Medicaid Other | Admitting: Certified Nurse Midwife

## 2019-08-07 ENCOUNTER — Other Ambulatory Visit: Payer: Self-pay

## 2019-08-07 ENCOUNTER — Encounter: Payer: Self-pay | Admitting: Certified Nurse Midwife

## 2019-08-07 VITALS — BP 111/56 | HR 77 | Wt 197.0 lb

## 2019-08-07 DIAGNOSIS — Z3493 Encounter for supervision of normal pregnancy, unspecified, third trimester: Secondary | ICD-10-CM

## 2019-08-07 NOTE — Patient Instructions (Signed)
Braxton Hicks Contractions °Contractions of the uterus can occur throughout pregnancy, but they are not always a sign that you are in labor. You may have practice contractions called Braxton Hicks contractions. These false labor contractions are sometimes confused with true labor. °What are Braxton Hicks contractions? °Braxton Hicks contractions are tightening movements that occur in the muscles of the uterus before labor. Unlike true labor contractions, these contractions do not result in opening (dilation) and thinning of the cervix. Toward the end of pregnancy (32-34 weeks), Braxton Hicks contractions can happen more often and may become stronger. These contractions are sometimes difficult to tell apart from true labor because they can be very uncomfortable. You should not feel embarrassed if you go to the hospital with false labor. °Sometimes, the only way to tell if you are in true labor is for your health care provider to look for changes in the cervix. The health care provider will do a physical exam and may monitor your contractions. If you are not in true labor, the exam should show that your cervix is not dilating and your water has not broken. °If there are no other health problems associated with your pregnancy, it is completely safe for you to be sent home with false labor. You may continue to have Braxton Hicks contractions until you go into true labor. °How to tell the difference between true labor and false labor °True labor °· Contractions last 30-70 seconds. °· Contractions become very regular. °· Discomfort is usually felt in the top of the uterus, and it spreads to the lower abdomen and low back. °· Contractions do not go away with walking. °· Contractions usually become more intense and increase in frequency. °· The cervix dilates and gets thinner. °False labor °· Contractions are usually shorter and not as strong as true labor contractions. °· Contractions are usually irregular. °· Contractions  are often felt in the front of the lower abdomen and in the groin. °· Contractions may go away when you walk around or change positions while lying down. °· Contractions get weaker and are shorter-lasting as time goes on. °· The cervix usually does not dilate or become thin. °Follow these instructions at home: ° °· Take over-the-counter and prescription medicines only as told by your health care provider. °· Keep up with your usual exercises and follow other instructions from your health care provider. °· Eat and drink lightly if you think you are going into labor. °· If Braxton Hicks contractions are making you uncomfortable: °? Change your position from lying down or resting to walking, or change from walking to resting. °? Sit and rest in a tub of warm water. °? Drink enough fluid to keep your urine pale yellow. Dehydration may cause these contractions. °? Do slow and deep breathing several times an hour. °· Keep all follow-up prenatal visits as told by your health care provider. This is important. °Contact a health care provider if: °· You have a fever. °· You have continuous pain in your abdomen. °Get help right away if: °· Your contractions become stronger, more regular, and closer together. °· You have fluid leaking or gushing from your vagina. °· You pass blood-tinged mucus (bloody show). °· You have bleeding from your vagina. °· You have low back pain that you never had before. °· You feel your baby’s head pushing down and causing pelvic pressure. °· Your baby is not moving inside you as much as it used to. °Summary °· Contractions that occur before labor are   called Braxton Hicks contractions, false labor, or practice contractions. °· Braxton Hicks contractions are usually shorter, weaker, farther apart, and less regular than true labor contractions. True labor contractions usually become progressively stronger and regular, and they become more frequent. °· Manage discomfort from Braxton Hicks contractions  by changing position, resting in a warm bath, drinking plenty of water, or practicing deep breathing. °This information is not intended to replace advice given to you by your health care provider. Make sure you discuss any questions you have with your health care provider. °Document Revised: 03/31/2017 Document Reviewed: 09/01/2016 °Elsevier Patient Education © 2020 Elsevier Inc. ° °

## 2019-08-07 NOTE — Progress Notes (Signed)
ROB doing well. Feels good movement. Declines SVE. HR in upper qudrant u/s for positioning confirms vertex. Labor precautions reviewed. RSB checklist complete. Follow up 1 wk.   Doreene Burke, CNM

## 2019-08-14 ENCOUNTER — Encounter: Payer: Self-pay | Admitting: Certified Nurse Midwife

## 2019-08-14 ENCOUNTER — Ambulatory Visit (INDEPENDENT_AMBULATORY_CARE_PROVIDER_SITE_OTHER): Payer: Medicaid Other | Admitting: Certified Nurse Midwife

## 2019-08-14 ENCOUNTER — Other Ambulatory Visit: Payer: Self-pay

## 2019-08-14 VITALS — BP 109/70 | HR 89 | Wt 199.1 lb

## 2019-08-14 DIAGNOSIS — L299 Pruritus, unspecified: Secondary | ICD-10-CM

## 2019-08-14 DIAGNOSIS — Z3A39 39 weeks gestation of pregnancy: Secondary | ICD-10-CM

## 2019-08-14 LAB — POCT URINALYSIS DIPSTICK OB
Bilirubin, UA: NEGATIVE
Blood, UA: NEGATIVE
Glucose, UA: NEGATIVE
Ketones, UA: NEGATIVE
Leukocytes, UA: NEGATIVE
Nitrite, UA: NEGATIVE
POC,PROTEIN,UA: NEGATIVE
Spec Grav, UA: 1.02 (ref 1.010–1.025)
Urobilinogen, UA: 0.2 E.U./dL
pH, UA: 5 (ref 5.0–8.0)

## 2019-08-14 NOTE — Patient Instructions (Signed)
Braxton Hicks Contractions °Contractions of the uterus can occur throughout pregnancy, but they are not always a sign that you are in labor. You may have practice contractions called Braxton Hicks contractions. These false labor contractions are sometimes confused with true labor. °What are Braxton Hicks contractions? °Braxton Hicks contractions are tightening movements that occur in the muscles of the uterus before labor. Unlike true labor contractions, these contractions do not result in opening (dilation) and thinning of the cervix. Toward the end of pregnancy (32-34 weeks), Braxton Hicks contractions can happen more often and may become stronger. These contractions are sometimes difficult to tell apart from true labor because they can be very uncomfortable. You should not feel embarrassed if you go to the hospital with false labor. °Sometimes, the only way to tell if you are in true labor is for your health care provider to look for changes in the cervix. The health care provider will do a physical exam and may monitor your contractions. If you are not in true labor, the exam should show that your cervix is not dilating and your water has not broken. °If there are no other health problems associated with your pregnancy, it is completely safe for you to be sent home with false labor. You may continue to have Braxton Hicks contractions until you go into true labor. °How to tell the difference between true labor and false labor °True labor °· Contractions last 30-70 seconds. °· Contractions become very regular. °· Discomfort is usually felt in the top of the uterus, and it spreads to the lower abdomen and low back. °· Contractions do not go away with walking. °· Contractions usually become more intense and increase in frequency. °· The cervix dilates and gets thinner. °False labor °· Contractions are usually shorter and not as strong as true labor contractions. °· Contractions are usually irregular. °· Contractions  are often felt in the front of the lower abdomen and in the groin. °· Contractions may go away when you walk around or change positions while lying down. °· Contractions get weaker and are shorter-lasting as time goes on. °· The cervix usually does not dilate or become thin. °Follow these instructions at home: ° °· Take over-the-counter and prescription medicines only as told by your health care provider. °· Keep up with your usual exercises and follow other instructions from your health care provider. °· Eat and drink lightly if you think you are going into labor. °· If Braxton Hicks contractions are making you uncomfortable: °? Change your position from lying down or resting to walking, or change from walking to resting. °? Sit and rest in a tub of warm water. °? Drink enough fluid to keep your urine pale yellow. Dehydration may cause these contractions. °? Do slow and deep breathing several times an hour. °· Keep all follow-up prenatal visits as told by your health care provider. This is important. °Contact a health care provider if: °· You have a fever. °· You have continuous pain in your abdomen. °Get help right away if: °· Your contractions become stronger, more regular, and closer together. °· You have fluid leaking or gushing from your vagina. °· You pass blood-tinged mucus (bloody show). °· You have bleeding from your vagina. °· You have low back pain that you never had before. °· You feel your baby’s head pushing down and causing pelvic pressure. °· Your baby is not moving inside you as much as it used to. °Summary °· Contractions that occur before labor are   called Braxton Hicks contractions, false labor, or practice contractions. °· Braxton Hicks contractions are usually shorter, weaker, farther apart, and less regular than true labor contractions. True labor contractions usually become progressively stronger and regular, and they become more frequent. °· Manage discomfort from Braxton Hicks contractions  by changing position, resting in a warm bath, drinking plenty of water, or practicing deep breathing. °This information is not intended to replace advice given to you by your health care provider. Make sure you discuss any questions you have with your health care provider. °Document Revised: 03/31/2017 Document Reviewed: 09/01/2016 °Elsevier Patient Education © 2020 Elsevier Inc. ° °

## 2019-08-14 NOTE — Progress Notes (Signed)
ROB doing well. Feels good movement. SVE today 1/thick/-2, pt is using herbal prep handout. Discussed self help measures ( walking, nipple stim, birthing ball, squats) to help. Pt complains of itching. She  Denies rash ( none seen on exam). Mostly occurs at night. She has not tried benadryl. Labs collected today. Discussed induction around 41 wks. She verbalizes understanding and agrees. Follow up 1 wk for u/s and ROB with Marcelino Duster.   Alicia Stephens, CNM

## 2019-08-16 LAB — COMPREHENSIVE METABOLIC PANEL
ALT: 10 IU/L (ref 0–32)
AST: 16 IU/L (ref 0–40)
Albumin/Globulin Ratio: 1.6 (ref 1.2–2.2)
Albumin: 3.8 g/dL — ABNORMAL LOW (ref 3.9–5.0)
Alkaline Phosphatase: 129 IU/L — ABNORMAL HIGH (ref 39–117)
BUN/Creatinine Ratio: 17 (ref 9–23)
BUN: 10 mg/dL (ref 6–20)
Bilirubin Total: 0.4 mg/dL (ref 0.0–1.2)
CO2: 18 mmol/L — ABNORMAL LOW (ref 20–29)
Calcium: 9.1 mg/dL (ref 8.7–10.2)
Chloride: 103 mmol/L (ref 96–106)
Creatinine, Ser: 0.6 mg/dL (ref 0.57–1.00)
GFR calc Af Amer: 143 mL/min/{1.73_m2} (ref >59)
GFR calc non Af Amer: 124 mL/min/{1.73_m2} (ref >59)
Globulin, Total: 2.4 g/dL (ref 1.5–4.5)
Glucose: 83 mg/dL (ref 65–99)
Potassium: 4.4 mmol/L (ref 3.5–5.2)
Sodium: 135 mmol/L (ref 134–144)
Total Protein: 6.2 g/dL (ref 6.0–8.5)

## 2019-08-16 LAB — BILE ACIDS, TOTAL: Bile Acids Total: 5 umol/L (ref 0.0–10.0)

## 2019-08-20 ENCOUNTER — Ambulatory Visit (INDEPENDENT_AMBULATORY_CARE_PROVIDER_SITE_OTHER): Payer: Medicaid Other | Admitting: Certified Nurse Midwife

## 2019-08-20 ENCOUNTER — Ambulatory Visit (INDEPENDENT_AMBULATORY_CARE_PROVIDER_SITE_OTHER): Payer: Medicaid Other

## 2019-08-20 ENCOUNTER — Encounter: Payer: Self-pay | Admitting: Certified Nurse Midwife

## 2019-08-20 ENCOUNTER — Other Ambulatory Visit: Payer: Self-pay

## 2019-08-20 VITALS — BP 110/69 | HR 72 | Wt 201.0 lb

## 2019-08-20 DIAGNOSIS — Z3A39 39 weeks gestation of pregnancy: Secondary | ICD-10-CM | POA: Diagnosis not present

## 2019-08-20 LAB — POCT URINALYSIS DIPSTICK OB
Bilirubin, UA: NEGATIVE
Blood, UA: NEGATIVE
Glucose, UA: NEGATIVE
Ketones, UA: NEGATIVE
Leukocytes, UA: NEGATIVE
Nitrite, UA: NEGATIVE
POC,PROTEIN,UA: NEGATIVE
Spec Grav, UA: 1.015 (ref 1.010–1.025)
Urobilinogen, UA: 0.2 E.U./dL
pH, UA: 7.5 (ref 5.0–8.0)

## 2019-08-20 NOTE — Patient Instructions (Signed)
Braxton Hicks Contractions °Contractions of the uterus can occur throughout pregnancy, but they are not always a sign that you are in labor. You may have practice contractions called Braxton Hicks contractions. These false labor contractions are sometimes confused with true labor. °What are Braxton Hicks contractions? °Braxton Hicks contractions are tightening movements that occur in the muscles of the uterus before labor. Unlike true labor contractions, these contractions do not result in opening (dilation) and thinning of the cervix. Toward the end of pregnancy (32-34 weeks), Braxton Hicks contractions can happen more often and may become stronger. These contractions are sometimes difficult to tell apart from true labor because they can be very uncomfortable. You should not feel embarrassed if you go to the hospital with false labor. °Sometimes, the only way to tell if you are in true labor is for your health care provider to look for changes in the cervix. The health care provider will do a physical exam and may monitor your contractions. If you are not in true labor, the exam should show that your cervix is not dilating and your water has not broken. °If there are no other health problems associated with your pregnancy, it is completely safe for you to be sent home with false labor. You may continue to have Braxton Hicks contractions until you go into true labor. °How to tell the difference between true labor and false labor °True labor °· Contractions last 30-70 seconds. °· Contractions become very regular. °· Discomfort is usually felt in the top of the uterus, and it spreads to the lower abdomen and low back. °· Contractions do not go away with walking. °· Contractions usually become more intense and increase in frequency. °· The cervix dilates and gets thinner. °False labor °· Contractions are usually shorter and not as strong as true labor contractions. °· Contractions are usually irregular. °· Contractions  are often felt in the front of the lower abdomen and in the groin. °· Contractions may go away when you walk around or change positions while lying down. °· Contractions get weaker and are shorter-lasting as time goes on. °· The cervix usually does not dilate or become thin. °Follow these instructions at home: ° °· Take over-the-counter and prescription medicines only as told by your health care provider. °· Keep up with your usual exercises and follow other instructions from your health care provider. °· Eat and drink lightly if you think you are going into labor. °· If Braxton Hicks contractions are making you uncomfortable: °? Change your position from lying down or resting to walking, or change from walking to resting. °? Sit and rest in a tub of warm water. °? Drink enough fluid to keep your urine pale yellow. Dehydration may cause these contractions. °? Do slow and deep breathing several times an hour. °· Keep all follow-up prenatal visits as told by your health care provider. This is important. °Contact a health care provider if: °· You have a fever. °· You have continuous pain in your abdomen. °Get help right away if: °· Your contractions become stronger, more regular, and closer together. °· You have fluid leaking or gushing from your vagina. °· You pass blood-tinged mucus (bloody show). °· You have bleeding from your vagina. °· You have low back pain that you never had before. °· You feel your baby’s head pushing down and causing pelvic pressure. °· Your baby is not moving inside you as much as it used to. °Summary °· Contractions that occur before labor are   called Braxton Hicks contractions, false labor, or practice contractions. °· Braxton Hicks contractions are usually shorter, weaker, farther apart, and less regular than true labor contractions. True labor contractions usually become progressively stronger and regular, and they become more frequent. °· Manage discomfort from Braxton Hicks contractions  by changing position, resting in a warm bath, drinking plenty of water, or practicing deep breathing. °This information is not intended to replace advice given to you by your health care provider. Make sure you discuss any questions you have with your health care provider. °Document Revised: 03/31/2017 Document Reviewed: 09/01/2016 °Elsevier Patient Education © 2020 Elsevier Inc. ° °

## 2019-08-20 NOTE — Progress Notes (Signed)
ROB doing well. Feels good movement. U/s for postdates (see below) reviewed with pt. Discussed inductions. Reviewed use of foley bulb prior to admission for inductions. Pt verbalizes and agrees. Follow ut Thursday or Friday for NST. Induction scheduled for Tuesday 4/27 @ 0000. Foley bulb placement on Monday in the office at noon.   Doreene Burke, CNM   Patient Name: Alicia Stephens DOB: Jun 11, 1990 MRN: [id number] ULTRASOUND REPORT  Location: [facility] Date of Service: 08/20/2019   Indications:growth/afi Findings:  Mason Jim intrauterine pregnancy is visualized with FHR at 129 BPM. Biometrics give an (U/S) Gestational age of [redacted]w[redacted]d and an (U/S) EDD of 08/20/2019; this correlates with the clinically established Estimated Date of Delivery: 08/21/19.  Fetal presentation is Cephalic.  Placenta: anterior. Grade: 2 AFI: 8.9 cm  Growth percentile is 85. EFW: 4011 g ( 8 lbs 14 oz)   Impression: 1. [redacted]w[redacted]d Viable Singleton Intrauterine pregnancy previously established criteria. 2. Growth is 85 %ile.  AFI is 8.9 cm.   Recommendations: 1.Clinical correlation with the patient's History and Physical Exam.   Jenine  M. Marciano Sequin     RDMS

## 2019-08-22 ENCOUNTER — Other Ambulatory Visit: Payer: Self-pay

## 2019-08-22 ENCOUNTER — Other Ambulatory Visit: Payer: Medicaid Other

## 2019-08-22 ENCOUNTER — Ambulatory Visit (INDEPENDENT_AMBULATORY_CARE_PROVIDER_SITE_OTHER): Payer: Medicaid Other | Admitting: Certified Nurse Midwife

## 2019-08-22 VITALS — BP 94/67 | HR 80 | Wt 204.2 lb

## 2019-08-22 DIAGNOSIS — Z3493 Encounter for supervision of normal pregnancy, unspecified, third trimester: Secondary | ICD-10-CM

## 2019-08-22 DIAGNOSIS — Z3A40 40 weeks gestation of pregnancy: Secondary | ICD-10-CM | POA: Diagnosis not present

## 2019-08-22 DIAGNOSIS — Z3689 Encounter for other specified antenatal screening: Secondary | ICD-10-CM

## 2019-08-22 LAB — POCT URINALYSIS DIPSTICK OB
Bilirubin, UA: NEGATIVE
Bilirubin, UA: NEGATIVE
Blood, UA: NEGATIVE
Blood, UA: NEGATIVE
Glucose, UA: NEGATIVE
Glucose, UA: NEGATIVE
Ketones, UA: NEGATIVE
Ketones, UA: NEGATIVE
Leukocytes, UA: NEGATIVE
Leukocytes, UA: NEGATIVE
Nitrite, UA: NEGATIVE
Nitrite, UA: NEGATIVE
POC,PROTEIN,UA: NEGATIVE
POC,PROTEIN,UA: NEGATIVE
Spec Grav, UA: 1.015 (ref 1.010–1.025)
Spec Grav, UA: 1.01 (ref 1.010–1.025)
Urobilinogen, UA: 0.2 E.U./dL
Urobilinogen, UA: 0.2 E.U./dL
pH, UA: 5 (ref 5.0–8.0)
pH, UA: 5 (ref 5.0–8.0)

## 2019-08-22 NOTE — Progress Notes (Signed)
Subjective:   Alicia Stephens is a 29 y.o. G1P0 [redacted]w[redacted]d being seen for NST due to postdates pregnancy. No questions or concerns. Desires SVE.   Denies regular contractions, vaginal bleeding or leaking of fluid.  Reports good fetal movement.  The following portions of the patient's history were reviewed and updated as appropriate: allergies, current medications, past family history, past medical history, past social history, past surgical history and problem list.   Review of Systems:  ROS negative except as noted above. Information obtained from patient.   Objective:   BP 94/67   Pulse 80   Wt 204 lb 3 oz (92.6 kg)   LMP 11/14/2018   BMI 30.15 kg/m   FHT: Fetal Heart Rate (bpm): 130  Fetal Movement: Movement: Present  Presentation: Presentation: Vertex    Abdomen:  soft, gravid, appropriate for gestational age,non-tender  Cervix: 1cm,50%,-2, firm,posterior    Results for orders placed or performed in visit on 08/22/19 (from the past 24 hour(s))  POC Urinalysis Dipstick OB     Status: None   Collection Time: 08/22/19  2:46 PM  Result Value Ref Range   Color, UA yellow    Clarity, UA clear    Glucose, UA Negative Negative   Bilirubin, UA neg    Ketones, UA neg    Spec Grav, UA 1.010 1.010 - 1.025   Blood, UA neg    pH, UA 5.0 5.0 - 8.0   POC,PROTEIN,UA Negative Negative, Trace, Small (1+), Moderate (2+), Large (3+), 4+   Urobilinogen, UA 0.2 0.2 or 1.0 E.U./dL   Nitrite, UA neg    Leukocytes, UA Negative Negative   Appearance     Odor      Assessment:   Pregnancy:  G1P0 at [redacted]w[redacted]d  1. Third trimester pregnancy  - POC Urinalysis Dipstick OB  2. [redacted] weeks gestation of pregnancy  - POC Urinalysis Dipstick OB  3. NST (non-stress test) reactive   Plan:   NST performed today was reviewed and was found to be reactive. Baseline 130 bpm with Moderate variability; No decels noted.  Labor symptoms: vaginal bleeding, contractions and leaking of fluid reviewed in  detail.  Fetal movement precautions reviewed.  Follow up as previously scheduled or sooner if needed.   Gunnar Bulla, CNM [facility], [facility]

## 2019-08-22 NOTE — Patient Instructions (Signed)
Labor Induction  Labor induction is when steps are taken to cause a pregnant woman to begin the labor process. Most women go into labor on their own between 37 weeks and 42 weeks of pregnancy. When this does not happen or when there is a medical need for labor to begin, steps may be taken to induce labor. Labor induction causes a pregnant woman's uterus to contract. It also causes the cervix to soften (ripen), open (dilate), and thin out (efface). Usually, labor is not induced before 39 weeks of pregnancy unless there is a medical reason to do so. Your health care provider will determine if labor induction is needed. Before inducing labor, your health care provider will consider a number of factors, including:  Your medical condition and your baby's.  How many weeks along you are in your pregnancy.  How mature your baby's lungs are.  The condition of your cervix.  The position of your baby.  The size of your birth canal. What are some reasons for labor induction? Labor may be induced if:  Your health or your baby's health is at risk.  Your pregnancy is overdue by 1 week or more.  Your water breaks but labor does not start on its own.  There is a low amount of amniotic fluid around your baby. You may also choose (elect) to have labor induced at a certain time. Generally, elective labor induction is done no earlier than 39 weeks of pregnancy. What methods are used for labor induction? Methods used for labor induction include:  Prostaglandin medicine. This medicine starts contractions and causes the cervix to dilate and ripen. It can be taken by mouth (orally) or by being inserted into the vagina (suppository).  Inserting a small, thin tube (catheter) with a balloon into the vagina and then expanding the balloon with water to dilate the cervix.  Stripping the membranes. In this method, your health care provider gently separates amniotic sac tissue from the cervix. This causes the  cervix to stretch, which in turn causes the release of a hormone called progesterone. The hormone causes the uterus to contract. This procedure is often done during an office visit, after which you will be sent home to wait for contractions to begin.  Breaking the water. In this method, your health care provider uses a small instrument to make a small hole in the amniotic sac. This eventually causes the amniotic sac to break. Contractions should begin after a few hours.  Medicine to trigger or strengthen contractions. This medicine is given through an IV that is inserted into a vein in your arm. Except for membrane stripping, which can be done in a clinic, labor induction is done in the hospital so that you and your baby can be carefully monitored. How long does it take for labor to be induced? The length of time it takes to induce labor depends on how ready your body is for labor. Some inductions can take up to 2-3 days, while others may take less than a day. Induction may take longer if:  You are induced early in your pregnancy.  It is your first pregnancy.  Your cervix is not ready. What are some risks associated with labor induction? Some risks associated with labor induction include:  Changes in fetal heart rate, such as being too high, too low, or irregular (erratic).  Failed induction.  Infection in the mother or the baby.  Increased risk of having a cesarean delivery.  Fetal death.  Breaking off (abruption)   of the placenta from the uterus (rare).  Rupture of the uterus (very rare). When induction is needed for medical reasons, the benefits of induction generally outweigh the risks. What are some reasons for not inducing labor? Labor induction should not be done if:  Your baby does not tolerate contractions.  You have had previous surgeries on your uterus, such as a myomectomy, removal of fibroids, or a vertical scar from a previous cesarean delivery.  Your placenta lies  very low in your uterus and blocks the opening of the cervix (placenta previa).  Your baby is not in a head-down position.  The umbilical cord drops down into the birth canal in front of the baby.  There are unusual circumstances, such as the baby being very early (premature).  You have had more than 2 previous cesarean deliveries. Summary  Labor induction is when steps are taken to cause a pregnant woman to begin the labor process.  Labor induction causes a pregnant woman's uterus to contract. It also causes the cervix to ripen, dilate, and efface.  Labor is not induced before 39 weeks of pregnancy unless there is a medical reason to do so.  When induction is needed for medical reasons, the benefits of induction generally outweigh the risks. This information is not intended to replace advice given to you by your health care provider. Make sure you discuss any questions you have with your health care provider. Document Revised: 04/21/2017 Document Reviewed: 06/01/2016 Elsevier Patient Education  2020 Elsevier Inc.    Nonstress Test A nonstress test is a procedure that is done during pregnancy in order to check the baby's heartbeat. The procedure can help show if the baby (fetus) is healthy. It is commonly done when:  The baby is past his or her due date.  The pregnancy is high risk.  The baby is moving less than normal.  The mother has lost a pregnancy in the past.  The health care provider suspects a problem with the baby's growth.  There is too much or too little amniotic fluid. The procedure is often done in the third trimester of pregnancy to find out if an early delivery is needed and whether such a delivery is safe. During a nonstress test, the baby's heartbeat is monitored when the baby is resting and when the baby is moving. If the baby is healthy, the heart rate will increase when he or she moves or kicks and will return to normal when he or she rests. Tell a health  care provider about:  Any allergies you have.  Any medical conditions you have.  All medicines you are taking, including vitamins, herbs, eye drops, creams, and over-the-counter medicines. What are the risks? There are no risks to you or your baby from a nonstress test. This procedure should not be painful or uncomfortable. What happens before the procedure?  Eat a meal right before the test or as directed by your health care provider. Food may help encourage the baby to move.  Use the restroom right before the test. What happens during the procedure?  Two monitors will be placed on your abdomen. One will record the baby's heart rate and the other will record the contractions of your uterus.  You may be asked to lie down on your side or to sit upright.  You may be given a button to press when you feel your baby move.  Your health care provider will listen to your baby's heartbeat and recorded it. He or she  may also watch your baby's heartbeat on a screen.  If the baby seems to be sleeping, you may be asked to drink some juice or soda, eat a snack, or change positions. The procedure may vary among health care providers and hospitals. What happens after the procedure?  Your health care provider will discuss the test results with you and make recommendations for the future. Depending on the results, your health care provider may order additional tests or another course of action.  If your health care provider gave you any diet or activity instructions, make sure to follow them.  Keep all follow-up visits as told by your health care provider. This is important. Summary  A nonstress test is a procedure that is done during pregnancy in order to check the baby's heartbeat. The procedure can help show if the baby is healthy.  The procedure is often done in the third trimester of pregnancy to find out if an early delivery is needed and whether such a delivery is safe.  During a  nonstress test, the baby's heartbeat is monitored when the baby is resting and when the baby is moving. If the baby is healthy, the heart rate will increase when he or she moves or kicks and will return to normal when he or she rests.  Your health care provider will discuss the test results with you and make recommendations for the future. This information is not intended to replace advice given to you by your health care provider. Make sure you discuss any questions you have with your health care provider. Document Revised: 07/28/2016 Document Reviewed: 07/28/2016 Elsevier Patient Education  2020 Elsevier Inc.    Fetal Movement Counts Patient Name: ________________________________________________ Patient Due Date: ____________________ What is a fetal movement count?  A fetal movement count is the number of times that you feel your baby move during a certain amount of time. This may also be called a fetal kick count. A fetal movement count is recommended for every pregnant woman. You may be asked to start counting fetal movements as early as week 28 of your pregnancy. Pay attention to when your baby is most active. You may notice your baby's sleep and wake cycles. You may also notice things that make your baby move more. You should do a fetal movement count:  When your baby is normally most active.  At the same time each day. A good time to count movements is while you are resting, after having something to eat and drink. How do I count fetal movements? 1. Find a quiet, comfortable area. Sit, or lie down on your side. 2. Write down the date, the start time and stop time, and the number of movements that you felt between those two times. Take this information with you to your health care visits. 3. Write down your start time when you feel the first movement. 4. Count kicks, flutters, swishes, rolls, and jabs. You should feel at least 10 movements. 5. You may stop counting after you have felt  10 movements, or if you have been counting for 2 hours. Write down the stop time. 6. If you do not feel 10 movements in 2 hours, contact your health care provider for further instructions. Your health care provider may want to do additional tests to assess your baby's well-being. Contact a health care provider if:  You feel fewer than 10 movements in 2 hours.  Your baby is not moving like he or she usually does. Date: ____________ Start time:  ____________ Stop time: ____________ Movements: ____________ Date: ____________ Start time: ____________ Stop time: ____________ Movements: ____________ Date: ____________ Start time: ____________ Stop time: ____________ Movements: ____________ Date: ____________ Start time: ____________ Stop time: ____________ Movements: ____________ Date: ____________ Start time: ____________ Stop time: ____________ Movements: ____________ Date: ____________ Start time: ____________ Stop time: ____________ Movements: ____________ Date: ____________ Start time: ____________ Stop time: ____________ Movements: ____________ Date: ____________ Start time: ____________ Stop time: ____________ Movements: ____________ Date: ____________ Start time: ____________ Stop time: ____________ Movements: ____________ This information is not intended to replace advice given to you by your health care provider. Make sure you discuss any questions you have with your health care provider. Document Revised: 12/06/2018 Document Reviewed: 12/06/2018 Elsevier Patient Education  [place].

## 2019-08-23 ENCOUNTER — Encounter: Payer: Self-pay | Admitting: Certified Nurse Midwife

## 2019-08-26 ENCOUNTER — Encounter: Payer: Self-pay | Admitting: Obstetrics and Gynecology

## 2019-08-26 ENCOUNTER — Encounter: Payer: Self-pay | Admitting: Certified Nurse Midwife

## 2019-08-26 ENCOUNTER — Other Ambulatory Visit: Payer: Self-pay

## 2019-08-26 ENCOUNTER — Other Ambulatory Visit: Payer: Medicaid Other

## 2019-08-26 ENCOUNTER — Inpatient Hospital Stay
Admission: EM | Admit: 2019-08-26 | Discharge: 2019-08-29 | DRG: 807 | Disposition: A | Discharge status: Home / Self Care | Payer: Medicaid Other | Attending: Certified Nurse Midwife | Admitting: Certified Nurse Midwife

## 2019-08-26 ENCOUNTER — Ambulatory Visit (INDEPENDENT_AMBULATORY_CARE_PROVIDER_SITE_OTHER): Payer: Medicaid Other | Admitting: Certified Nurse Midwife

## 2019-08-26 VITALS — BP 121/64 | HR 89 | Wt 204.3 lb

## 2019-08-26 DIAGNOSIS — O480 Post-term pregnancy: Secondary | ICD-10-CM | POA: Diagnosis not present

## 2019-08-26 DIAGNOSIS — Z3A40 40 weeks gestation of pregnancy: Secondary | ICD-10-CM

## 2019-08-26 DIAGNOSIS — Z20822 Contact with and (suspected) exposure to covid-19: Secondary | ICD-10-CM | POA: Diagnosis present

## 2019-08-26 LAB — POCT URINALYSIS DIPSTICK OB
Bilirubin, UA: NEGATIVE
Blood, UA: NEGATIVE
Glucose, UA: NEGATIVE
Ketones, UA: NEGATIVE
Leukocytes, UA: NEGATIVE
Nitrite, UA: NEGATIVE
POC,PROTEIN,UA: NEGATIVE
Spec Grav, UA: 1.02 (ref 1.010–1.025)
Urobilinogen, UA: 0.2 E.U./dL
pH, UA: 5 (ref 5.0–8.0)

## 2019-08-26 LAB — FETAL NONSTRESS TEST

## 2019-08-26 LAB — CBC
HCT: 41.1 % (ref 36.0–46.0)
Hemoglobin: 13.8 g/dL (ref 12.0–15.0)
MCH: 30.7 pg (ref 26.0–34.0)
MCHC: 33.6 g/dL (ref 30.0–36.0)
MCV: 91.3 fL (ref 80.0–100.0)
Platelets: 273 10*3/uL (ref 150–400)
RBC: 4.5 MIL/uL (ref 3.87–5.11)
RDW: 13.6 % (ref 11.5–15.5)
WBC: 15.1 10*3/uL — ABNORMAL HIGH (ref 4.0–10.5)
nRBC: 0 % (ref 0.0–0.2)

## 2019-08-26 MED ORDER — ZOLPIDEM TARTRATE 5 MG PO TABS: 5.0000 mg | ORAL_TABLET | Freq: Once | ORAL | Status: DC

## 2019-08-26 MED ORDER — ACETAMINOPHEN 325 MG PO TABS
650.0000 mg | ORAL_TABLET | ORAL | Status: DC | PRN
  Administered 2019-08-27: 650 mg via ORAL
  Filled 2019-08-26: qty 2

## 2019-08-26 MED ORDER — BUTORPHANOL TARTRATE 1 MG/ML IJ SOLN
1.0000 mg | INTRAMUSCULAR | Status: DC | PRN
  Administered 2019-08-27: 1 mg via INTRAVENOUS
  Filled 2019-08-26: qty 1

## 2019-08-26 MED ORDER — SOD CITRATE-CITRIC ACID 500-334 MG/5ML PO SOLN: 30.0000 mL | ORAL | Status: DC | PRN

## 2019-08-26 MED ORDER — LACTATED RINGERS IV SOLN: 500.0000 mL | INTRAVENOUS | Status: DC | PRN

## 2019-08-26 MED ORDER — OXYTOCIN BOLUS FROM INFUSION
500.0000 mL | Freq: Once | INTRAVENOUS | Status: AC
  Administered 2019-08-27: 500 mL via INTRAVENOUS

## 2019-08-26 MED ORDER — ONDANSETRON HCL 4 MG/2ML IJ SOLN: 4.0000 mg | Freq: Four times a day (QID) | INTRAMUSCULAR | Status: DC | PRN

## 2019-08-26 MED ORDER — OXYTOCIN 40 UNITS IN NORMAL SALINE INFUSION - SIMPLE MED
2.5000 [IU]/h | INTRAVENOUS | Status: DC
  Administered 2019-08-27: 2.5 [IU]/h via INTRAVENOUS
  Filled 2019-08-26 (×2): qty 1000

## 2019-08-26 MED ORDER — MISOPROSTOL 50MCG HALF TABLET
50.0000 ug | ORAL_TABLET | ORAL | Status: DC
  Administered 2019-08-26 – 2019-08-27 (×2): 50 ug via VAGINAL
  Filled 2019-08-26 (×2): qty 1

## 2019-08-26 MED ORDER — TERBUTALINE SULFATE 1 MG/ML IJ SOLN: 0.2500 mg | Freq: Once | INTRAMUSCULAR | Status: DC | PRN

## 2019-08-26 MED ORDER — LIDOCAINE HCL (PF) 1 % IJ SOLN
30.0000 mL | INTRAMUSCULAR | Status: AC | PRN
  Administered 2019-08-27: 1.2 mL via SUBCUTANEOUS

## 2019-08-26 MED ORDER — LACTATED RINGERS IV SOLN: INTRAVENOUS | Status: DC

## 2019-08-26 NOTE — Patient Instructions (Signed)

## 2019-08-26 NOTE — Progress Notes (Signed)
FOLEY BULB PROCEDURE NOTE:    I have verified that the patient is an appropriate candidate for outpatient foley bulb placement.  She has consented to foley bulb placement today. She has no concerns at this time. A pre-procedure NST performed today was reviewed and was found to be reactive.     The patient was placed in the dorsal lithotomy position.  A vaginal examination was performed and a finger was placed at the base of the cervical os.  The cervix was noted to be 1 cm dilated  A foley catheter was advanced into the cervix slightly pass the internal cervical os.  The foley balloon was filled with 60 cc of normal saline.  Gentle traction was placed on the foley bulb, and the catheter was taped to the medial portion of the thigh.  The patient tolerated the procedure well.  A post-procedure NST was performed. Sterile technique was observed throughout.      NONSTRESS TEST INTERPRETATION  INDICATIONS: Foley bulb induction placement  FHR baseline: 145 bpm (pre-procedure) and 135  bpm (post procedure) RESULTS:Reactive x 2.  COMMENTS: tolerated procedure well.    PLAN: 1. To arrive at Labor and Delivery for scheduled induction of labor on at midnight tonight.  She was given post-procedure instructions.

## 2019-08-27 ENCOUNTER — Inpatient Hospital Stay: Payer: Medicaid Other | Admitting: Anesthesiology

## 2019-08-27 ENCOUNTER — Encounter: Payer: Self-pay | Admitting: Certified Nurse Midwife

## 2019-08-27 DIAGNOSIS — Z3A40 40 weeks gestation of pregnancy: Secondary | ICD-10-CM

## 2019-08-27 LAB — RESPIRATORY PANEL BY RT PCR (FLU A&B, COVID)
Influenza A by PCR: NEGATIVE
Influenza B by PCR: NEGATIVE
SARS Coronavirus 2 by RT PCR: NEGATIVE

## 2019-08-27 LAB — TYPE AND SCREEN
ABO/RH(D): A POS
Antibody Screen: NEGATIVE

## 2019-08-27 LAB — ABO/RH: ABO/RH(D): A POS

## 2019-08-27 MED ORDER — FENTANYL 2.5 MCG/ML W/ROPIVACAINE 0.15% IN NS 100 ML EPIDURAL (ARMC)
EPIDURAL | Status: AC
  Filled 2019-08-27: qty 100

## 2019-08-27 MED ORDER — WITCH HAZEL-GLYCERIN EX PADS
1.0000 "application " | MEDICATED_PAD | CUTANEOUS | Status: DC | PRN
  Administered 2019-08-27: 1 via TOPICAL
  Filled 2019-08-27 (×2): qty 100

## 2019-08-27 MED ORDER — SENNOSIDES-DOCUSATE SODIUM 8.6-50 MG PO TABS
2.0000 | ORAL_TABLET | ORAL | Status: DC
  Administered 2019-08-28 – 2019-08-29 (×2): 2 via ORAL
  Filled 2019-08-27 (×2): qty 2

## 2019-08-27 MED ORDER — ONDANSETRON HCL 4 MG PO TABS: 4.0000 mg | ORAL_TABLET | ORAL | Status: DC | PRN

## 2019-08-27 MED ORDER — DIPHENHYDRAMINE HCL 50 MG/ML IJ SOLN: 12.5000 mg | INTRAMUSCULAR | Status: DC | PRN

## 2019-08-27 MED ORDER — ACETAMINOPHEN 325 MG PO TABS
650.0000 mg | ORAL_TABLET | ORAL | Status: DC | PRN
  Administered 2019-08-27 – 2019-08-29 (×6): 650 mg via ORAL
  Filled 2019-08-27 (×6): qty 2

## 2019-08-27 MED ORDER — SIMETHICONE 80 MG PO CHEW: 80.0000 mg | CHEWABLE_TABLET | ORAL | Status: DC | PRN

## 2019-08-27 MED ORDER — OXYCODONE-ACETAMINOPHEN 5-325 MG PO TABS: 1.0000 | ORAL_TABLET | ORAL | Status: DC | PRN

## 2019-08-27 MED ORDER — TERBUTALINE SULFATE 1 MG/ML IJ SOLN: 0.2500 mg | Freq: Once | INTRAMUSCULAR | Status: DC | PRN

## 2019-08-27 MED ORDER — FENTANYL 2.5 MCG/ML W/ROPIVACAINE 0.15% IN NS 100 ML EPIDURAL (ARMC)
12.0000 mL/h | EPIDURAL | Status: DC
  Administered 2019-08-27: 12 mL/h via EPIDURAL
  Filled 2019-08-27: qty 100

## 2019-08-27 MED ORDER — LACTATED RINGERS IV SOLN: 500.0000 mL | Freq: Once | INTRAVENOUS | Status: DC

## 2019-08-27 MED ORDER — BENZOCAINE-MENTHOL 20-0.5 % EX AERO
1.0000 "application " | INHALATION_SPRAY | CUTANEOUS | Status: DC | PRN
  Administered 2019-08-27: 1 via TOPICAL
  Filled 2019-08-27 (×3): qty 56

## 2019-08-27 MED ORDER — METHYLERGONOVINE MALEATE 0.2 MG/ML IJ SOLN: 0.2000 mg | INTRAMUSCULAR | Status: DC | PRN

## 2019-08-27 MED ORDER — IBUPROFEN 600 MG PO TABS
600.0000 mg | ORAL_TABLET | Freq: Four times a day (QID) | ORAL | Status: DC
  Administered 2019-08-27 – 2019-08-29 (×7): 600 mg via ORAL
  Filled 2019-08-27 (×7): qty 1

## 2019-08-27 MED ORDER — LIDOCAINE-EPINEPHRINE (PF) 1.5 %-1:200000 IJ SOLN
INTRAMUSCULAR | Status: DC | PRN
  Administered 2019-08-27: 3 mL via EPIDURAL

## 2019-08-27 MED ORDER — OXYTOCIN 10 UNIT/ML IJ SOLN
INTRAMUSCULAR | Status: AC
  Filled 2019-08-27: qty 2

## 2019-08-27 MED ORDER — ONDANSETRON HCL 4 MG/2ML IJ SOLN: 4.0000 mg | INTRAMUSCULAR | Status: DC | PRN

## 2019-08-27 MED ORDER — FENTANYL 2.5 MCG/ML W/ROPIVACAINE 0.15% IN NS 100 ML EPIDURAL (ARMC)
EPIDURAL | Status: DC | PRN
  Administered 2019-08-27: 12 mL/h via EPIDURAL

## 2019-08-27 MED ORDER — OXYCODONE-ACETAMINOPHEN 5-325 MG PO TABS: 2.0000 | ORAL_TABLET | ORAL | Status: DC | PRN

## 2019-08-27 MED ORDER — METHYLERGONOVINE MALEATE 0.2 MG PO TABS
0.2000 mg | ORAL_TABLET | ORAL | Status: DC | PRN
  Filled 2019-08-27: qty 1

## 2019-08-27 MED ORDER — LIDOCAINE HCL (PF) 1 % IJ SOLN
INTRAMUSCULAR | Status: AC
  Filled 2019-08-27: qty 30

## 2019-08-27 MED ORDER — DIBUCAINE (PERIANAL) 1 % EX OINT
1.0000 "application " | TOPICAL_OINTMENT | CUTANEOUS | Status: DC | PRN
  Administered 2019-08-27: 1 via RECTAL
  Filled 2019-08-27 (×2): qty 28

## 2019-08-27 MED ORDER — AMMONIA AROMATIC IN INHA
RESPIRATORY_TRACT | Status: AC
  Filled 2019-08-27: qty 10

## 2019-08-27 MED ORDER — COCONUT OIL OIL
1.0000 "application " | TOPICAL_OIL | Status: DC | PRN
  Administered 2019-08-27: 1 via TOPICAL
  Filled 2019-08-27: qty 120

## 2019-08-27 MED ORDER — PHENYLEPHRINE 40 MCG/ML (10ML) SYRINGE FOR IV PUSH (FOR BLOOD PRESSURE SUPPORT)
80.0000 ug | PREFILLED_SYRINGE | INTRAVENOUS | Status: DC | PRN
  Filled 2019-08-27: qty 10

## 2019-08-27 MED ORDER — FERROUS SULFATE 325 (65 FE) MG PO TABS
325.0000 mg | ORAL_TABLET | Freq: Every day | ORAL | Status: DC
  Administered 2019-08-28 – 2019-08-29 (×2): 325 mg via ORAL
  Filled 2019-08-27 (×2): qty 1

## 2019-08-27 MED ORDER — PRENATAL MULTIVITAMIN CH
1.0000 | ORAL_TABLET | Freq: Every day | ORAL | Status: DC
  Administered 2019-08-28: 1 via ORAL
  Filled 2019-08-27: qty 1

## 2019-08-27 MED ORDER — OXYTOCIN 40 UNITS IN NORMAL SALINE INFUSION - SIMPLE MED
1.0000 m[IU]/min | INTRAVENOUS | Status: DC
  Administered 2019-08-27: 4 m[IU]/min via INTRAVENOUS

## 2019-08-27 MED ORDER — MISOPROSTOL 200 MCG PO TABS
ORAL_TABLET | ORAL | Status: AC
  Administered 2019-08-27: 800 ug
  Filled 2019-08-27: qty 4

## 2019-08-27 MED ORDER — DOCUSATE SODIUM 100 MG PO CAPS
100.0000 mg | ORAL_CAPSULE | Freq: Two times a day (BID) | ORAL | Status: DC
  Administered 2019-08-27 – 2019-08-29 (×4): 100 mg via ORAL
  Filled 2019-08-27 (×4): qty 1

## 2019-08-27 MED ORDER — EPHEDRINE 5 MG/ML INJ
10.0000 mg | INTRAVENOUS | Status: DC | PRN
  Filled 2019-08-27: qty 2

## 2019-08-27 NOTE — Progress Notes (Signed)
LABOR NOTE   Alicia Stephens 29 y.o.@ at [redacted]w[redacted]d  SUBJECTIVE:  Comfortable, denies feeling pressure Analgesia: Epidural  OBJECTIVE:  BP 108/66 (BP Location: Right Arm)   Pulse 95   Temp 98 F (36.7 C) (Oral)   Resp 18   Ht 5\' 10"  (1.778 m)   Wt 92.7 kg   LMP 11/14/2018   SpO2 99%   BMI 29.32 kg/m  No intake/output data recorded.  She has shown cervical change. CERVIX: 7cm :  100%:   -2:   mid position:   soft SVE:   Dilation: 5 Effacement (%): 80 Station: -1 Exam by:: [email or web address], RN CONTRACTIONS: regular, every 2-3 minutes FHR: Fetal heart tracing reviewed. Baseline: 130 bpm, Variability: Good {> 6 bpm), Accelerations: Reactive and Decelerations: Absent Category I    Labs: Lab Results  Component Value Date   WBC 15.1 (H) 08/26/2019   HGB 13.8 08/26/2019   HCT 41.1 08/26/2019   MCV 91.3 08/26/2019   PLT 273 08/26/2019    ASSESSMENT: 1) Labor curve reviewed.       Progress: Active phase labor.     Membranes: ruptured, blood tinged  Active Problems:   Labor and delivery, indication for care Post dates  PLAN: continue present management   08/28/2019, CNM  08/27/2019 11:38 AM

## 2019-08-27 NOTE — Anesthesia Procedure Notes (Signed)
Epidural Patient location during procedure: OB Start time: 08/27/2019 5:17 AM End time: 08/27/2019 5:37 AM  Staffing Performed: anesthesiologist   Preanesthetic Checklist Completed: patient identified, IV checked, site marked, risks and benefits discussed, surgical consent, monitors and equipment checked, pre-op evaluation and timeout performed  Epidural Patient position: sitting Prep: ChloraPrep Patient monitoring: heart rate, continuous pulse ox and blood pressure Approach: midline Location: L4-L5 Injection technique: LOR saline  Needle:  Needle type: Tuohy  Needle gauge: 17 G Needle length: 9 cm and 9 Needle insertion depth: 7 cm Catheter type: closed end flexible Catheter size: 20 Guage Catheter at skin depth: 13 cm Test dose: negative and 1.5% lidocaine with Epi 1:200 K  Assessment Events: blood not aspirated, injection not painful, no injection resistance, no paresthesia and negative IV test  Additional Notes   Patient tolerated the insertion well without complications.Reason for block:procedure for pain

## 2019-08-27 NOTE — H&P (Addendum)
History and Physical   HPI  Alicia Stephens is a 29 y.o. G1P0 at [redacted]w[redacted]d Estimated Date of Delivery: 08/21/19 who is being admitted for induction of labor due to post dates.    OB History  OB History  Gravida Para Term Preterm AB Living  1 0 0 0 0 0  SAB TAB Ectopic Multiple Live Births  0 0 0 0 0    # Outcome Date GA Lbr Len/2nd Weight Sex Delivery Anes PTL Lv  1 Current             PROBLEM LIST  Pregnancy complications or risks: Patient Active Problem List   Diagnosis Date Noted  . Labor and delivery, indication for care 08/26/2019    Prenatal labs and studies: ABO, Rh: --/--/A POS Performed at [facility], [place], [address] [place],  [address]  [phone]/27 0033) Antibody: NEG (04/26 2319) Rubella: 6.31 (09/30 1424) RPR: Non Reactive (01/26 0929)  HBsAg: Negative (09/30 1424)  HIV: Non Reactive (09/30 1424)  ZTI:WPYKDXIP/-- (03/25 1355)   Past Medical History:  Diagnosis Date  . Breast mass      History reviewed. No pertinent surgical history.   Medications    Current Discharge Medication List    CONTINUE these medications which have NOT CHANGED   Details  famotidine (PEPCID) 10 MG tablet Take 10 mg by mouth 2 (two) times daily.    Prenat-Fe Poly-Methfol-FA-DHA (VITAFOL FE+) 90-0.6-0.4-200 MG CAPS Take 1 tablet by mouth daily. Qty: 30 capsule, Refills: 9    Evening Primrose Oil 500 MG CAPS Take by mouth.    lidocaine (XYLOCAINE) 5 % ointment Apply 1 application topically as needed. Qty: 35.44 g, Refills: 0         Allergies  Patient has no known allergies.  Review of Systems  Constitutional: negative Eyes: negative Ears, nose, mouth, throat, and face: negative Respiratory: negative Cardiovascular: negative Gastrointestinal: negative Genitourinary:negative Integument/breast: negative Hematologic/lymphatic: negative Musculoskeletal:negative Neurological: negative Behavioral/Psych: negative Endocrine:  negative Allergic/Immunologic: negative  Physical Exam  BP 108/66 (BP Location: Right Arm)   Pulse 95   Temp 98 F (36.7 C) (Oral)   Resp 18   Ht 5\' 10"  (1.778 m)   Wt 92.7 kg   LMP 11/14/2018   SpO2 99%   BMI 29.32 kg/m   Lungs:  CTA B Cardio: RRR  Abd: Soft, gravid, NT Presentation: cephalic EXT: No C/C/ 1+ Edema DTRs: 2+ B CERVIX:  See Prenatal records for more detailed PE.   She has shown cervical change. CERVIX: 4cm:  80%:   -2:   mid position:   firm SVE:   Dilation: 4 Effacement (%): 80 Station: -2 Exam by:: Grandville Silos, CNM  CONTRACTIONS: irregular, difficult to assess. IUPC placed FHR: Fetal heart tracing reviewed. Baseline: 135 bpm, Variability: Good {> 6 bpm), Accelerations: Reactive and Decelerations: Absent Category I   Test Results  Results for orders placed or performed during the hospital encounter of 08/26/19 (from the past 24 hour(s))  CBC     Status: Abnormal   Collection Time: 08/26/19 11:19 PM  Result Value Ref Range   WBC 15.1 (H) 4.0 - 10.5 K/uL   RBC 4.50 3.87 - 5.11 MIL/uL   Hemoglobin 13.8 12.0 - 15.0 g/dL   HCT 41.1 36.0 - 46.0 %   MCV 91.3 80.0 - 100.0 fL   MCH 30.7 26.0 - 34.0 pg   MCHC 33.6 30.0 - 36.0 g/dL   RDW 13.6 11.5 - 15.5 %  Platelets 273 150 - 400 K/uL   nRBC 0.0 0.0 - 0.2 %  Type and screen     Status: None   Collection Time: 08/26/19 11:19 PM  Result Value Ref Range   ABO/RH(D) A POS    Antibody Screen NEG    Sample Expiration      08/29/2019,2359 Performed at [facility], [address] [place], [place] [address]   Respiratory Panel by RT PCR (Flu A&B, Covid) - Nasopharyngeal Swab     Status: None   Collection Time: 08/26/19 11:19 PM   Specimen: Nasopharyngeal Swab  Result Value Ref Range   SARS Coronavirus 2 by RT PCR NEGATIVE NEGATIVE   Influenza A by PCR NEGATIVE NEGATIVE   Influenza B by PCR NEGATIVE NEGATIVE  ABO/Rh     Status: None   Collection Time: 08/27/19 12:33 AM  Result Value Ref  Range   ABO/RH(D)      A POS Performed at [facility], [address] [place], [place] [address]    Group B Strep negative  Assessment   G1P0 at [redacted]w[redacted]d Estimated Date of Delivery: 08/21/19  The fetus is reassuring.   Patient Active Problem List   Diagnosis Date Noted  . Labor and delivery, indication for care 08/26/2019    Plan  1. Admitted to L&D :   2. EFM:-- Category 1 3.  Epidural in place.   4. Admission labs completed 5. IV pitocin, IUPC placed, AROM 6.Anticipate NSVD   Doreene Burke, CNM  08/27/2019 8:03 AM

## 2019-08-27 NOTE — Anesthesia Preprocedure Evaluation (Signed)
Anesthesia Evaluation  Patient identified by MRN, date of birth, ID band Patient awake    Reviewed: Allergy & Precautions, NPO status , Patient's Chart, lab work & pertinent test results  History of Anesthesia Complications Negative for: history of anesthetic complications  Airway Mallampati: II       Dental   Pulmonary neg sleep apnea, neg COPD, Not current smoker,           Cardiovascular (-) hypertension(-) Past MI and (-) CHF (-) dysrhythmias (-) Valvular Problems/Murmurs     Neuro/Psych neg Seizures    GI/Hepatic Neg liver ROS, GERD (with pregnancy)  ,  Endo/Other  neg diabetes  Renal/GU negative Renal ROS     Musculoskeletal   Abdominal   Peds  Hematology   Anesthesia Other Findings   Reproductive/Obstetrics (+) Pregnancy                             Anesthesia Physical Anesthesia Plan  ASA: II  Anesthesia Plan: Epidural   Post-op Pain Management:    Induction:   PONV Risk Score and Plan:   Airway Management Planned:   Additional Equipment:   Intra-op Plan:   Post-operative Plan:   Informed Consent: I have reviewed the patients History and Physical, chart, labs and discussed the procedure including the risks, benefits and alternatives for the proposed anesthesia with the patient or authorized representative who has indicated his/her understanding and acceptance.       Plan Discussed with:   Anesthesia Plan Comments:         Anesthesia Quick Evaluation

## 2019-08-28 LAB — CBC
HCT: 35.5 % — ABNORMAL LOW (ref 36.0–46.0)
Hemoglobin: 12.4 g/dL (ref 12.0–15.0)
MCH: 31.5 pg (ref 26.0–34.0)
MCHC: 34.9 g/dL (ref 30.0–36.0)
MCV: 90.1 fL (ref 80.0–100.0)
Platelets: 224 10*3/uL (ref 150–400)
RBC: 3.94 MIL/uL (ref 3.87–5.11)
RDW: 13.9 % (ref 11.5–15.5)
WBC: 35.1 10*3/uL — ABNORMAL HIGH (ref 4.0–10.5)
nRBC: 0 % (ref 0.0–0.2)

## 2019-08-28 LAB — RPR: RPR Ser Ql: NONREACTIVE

## 2019-08-28 MED ORDER — METRONIDAZOLE 500 MG PO TABS
500.0000 mg | ORAL_TABLET | Freq: Two times a day (BID) | ORAL | Status: DC
  Administered 2019-08-28 – 2019-08-29 (×2): 500 mg via ORAL
  Filled 2019-08-28 (×3): qty 1

## 2019-08-28 MED ORDER — CEFAZOLIN SODIUM-DEXTROSE 2-4 GM/100ML-% IV SOLN
2.0000 g | Freq: Three times a day (TID) | INTRAVENOUS | Status: DC
  Administered 2019-08-28 (×2): 2 g via INTRAVENOUS
  Filled 2019-08-28 (×5): qty 100

## 2019-08-28 MED ORDER — SODIUM CHLORIDE 0.9 % IV SOLN
INTRAVENOUS | Status: DC | PRN
  Administered 2019-08-28 (×2): 250 mL via INTRAVENOUS

## 2019-08-28 NOTE — Discharge Instructions (Signed)

## 2019-08-28 NOTE — Progress Notes (Signed)
Patient ID: Alicia Stephens, female   DOB: January 27, 1991, 29 y.o.   MRN: [id number]  Post Partum Day # 1, s/p spontaneous vaginal birth, Rh positive, GBS negative, Elevated WBCs, Breastfeeding  Subjective:  Patient doing well, sitting in bed holding infant. Reports "belly button tenderness" with uterine massage.   Denies difficulty breathing or respiratory distress, chest pain, abdominal pain, excessive vaginal bleeding, dysuria, and leg pain or swelling.   Objective: Temp:  [97.9 F (36.6 C)-98.9 F (37.2 C)] 98.2 F (36.8 C) (04/28 0800) Pulse Rate:  [73-151] 75 (04/28 0800) Resp:  [18] 18 (04/28 0800) BP: (89-127)/(40-69) 100/69 (04/28 0800) SpO2:  [95 %-100 %] 99 % (04/28 0800)  Physical Exam:   General: alert and cooperative   Lungs: clear to auscultation bilaterally  Breasts:  deferred, no complaints  Heart: normal apical impulse  Abdomen: soft, non-tender; bowel sounds normal; no masses,  no organomegaly  Pelvis: Lochia: appropriate, Uterine Fundus: firm  Extremities: DVT Evaluation: No evidence of DVT seen on physical exam.  CBC Latest Ref Rng & Units 08/28/2019 08/26/2019 05/28/2019  WBC 4.0 - 10.5 K/uL 35.1(H) 15.1(H) 10.3  Hemoglobin 12.0 - 15.0 g/dL 79.0 24.0 97.3  Hematocrit 36.0 - 46.0 % 35.5(L) 41.1 38.7  Platelets 150 - 400 K/uL 224 273 282    Assessment:  29 year old female, G1P1, Post Partum Day # 1, s/p spontaneous vaginal birth, Rh positive, GBS negative, Elevated WBCs  Breastfeeding  Plan:  Routine postpartum care and orders.   IV Ancef for elevated WBCs. Repeat CBC in AM.   Reviewed red flag symptoms and when to call.   Continue orders as written. Reassess as needed.   Plan for discharge tomorrow   LOS: 2 days   Gunnar Bulla, CNM [facility], [facility] 08/28/2019 11:01 AM

## 2019-08-28 NOTE — Clinical Social Work Maternal (Signed)
CLINICAL SOCIAL WORK MATERNAL/CHILD NOTE  Patient Details  Name: Alicia Stephens MRN: [id number] Date of Birth: 1991-02-18  Date:  08/28/2019  Clinical Social Worker Initiating Note:  Oleh Genin, LCSW Date/Time: Initiated:  08/28/19/      Child's Name:  Trinidad Curet Love   Biological Parents:  Mother, Father   Need for Interpreter:  None   Reason for Referral:  Other (Comment)(Edinburgh Score: 12.)   Address:  [place] [address]    Phone number:  [phone] (home)     Additional phone number: None reported  Household Members/Support Persons (HM/SP):   Household Member/Support Person 1   HM/SP Name Relationship DOB or Age  HM/SP -[place] FOB unknown  HM/SP -2        HM/SP -3        HM/SP -4        HM/SP -5        HM/SP -6        HM/SP -7        HM/SP -8          Natural Supports (not living in the home):  Extended Family, Friends, Immediate Family   Professional Supports:     Employment: Unemployed   Type of Work:     Education:  [place] arranged:    Museum/gallery curator Resources:  Medicaid   Other Resources:  Physicist, medical , [place] Considerations Which May Impact Care:  None reported  Strengths:  Ability to meet basic needs , Compliance with medical plan , Home prepared for child , Understanding of illness, Pediatrician chosen   Psychotropic Medications:         Pediatrician:    [employer]  Pediatrician List:   [place]      Pediatrician Fax Number:    Risk Factors/Current Problems:  None   Cognitive State:  Alert , Able to Concentrate    Mood/Affect:  Calm , Happy    CSW Assessment:  CSW was consulted for [place] Score of 12 and drug 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 Per chart review, patient's UDS was positive for THC only and it was prior to her Initial Prenatal Visit.  Per policy, CSW would not assess substance use or make CPS report as there is no documented substance use or concern for substance use during pregnancy. CSW will follow Baby's Cord Screen for results per policy.   CSW met with MOB at bedside. Explained CSW role. Explained HIPPA. MOB elected for FOB Jenny Reichmann Love) to remain at bedside during assessment.   MOB reported she is feeling sore, but otherwise is "feeling good" post delivery. MOB was in good spirits, alert, and appropriate during assessment.   MOB reported a history of anxiety and depression. MOB stated took a medication for anxiety in the past, but did not like how it made her feel, so stopped taking it. MOB reported she feels she does well by using her coping skills. MOB reported she has never seen a counselor before, but is planning to get set up with a counselor after discharge due to a recent loss in her family and for support with bringing Baby home. MOB reported she is worried about PPD. CSW provided education and information sheets on PPD and SIDS. MOB and FOB verbalized understanding and agreed to reach out to MOB's  provider with any questions or needs for support, even after discharge. MOB reported she feels she has a great support system and is coping well emotionally at this time. She denied SI, HI, or DV.   MOB reported she needs to research to figure out where to go for counseling services. CSW provided list of counseling resources. Encouraged MOB to reach out with any questions.   MOB reported she receives [facility] and [employer] and has already informed her [employer] birth. MOB plans to use [facility] for [facility] medical care. MOB has a crib, bassinet, used (unexpired) car seat, and all other items needed for Baby. MOB drives and can provide transportation.   MOB and FOB denied any other needs. They were encouraged to reach out if any arise.   CSW Plan/Description:  Sudden Infant Death Syndrome (SIDS) Education,  Perinatal Mood and Anxiety Disorder (PMADs) Education, No Further Intervention Required/No Barriers to Discharge, CSW Will Continue to Monitor Umbilical Cord Tissue Drug Screen Results and Make Report if Warranted    [place], LCSW 2020/02/24, 2:00 PM

## 2019-08-28 NOTE — Anesthesia Postprocedure Evaluation (Signed)
Anesthesia Post Note  Patient: Alicia Stephens  Procedure(s) Performed: AN AD HOC LABOR EPIDURAL  Patient location during evaluation: Mother Baby Anesthesia Type: Epidural Level of consciousness: awake and alert Pain management: pain level controlled Vital Signs Assessment: post-procedure vital signs reviewed and stable Respiratory status: spontaneous breathing, nonlabored ventilation and respiratory function stable Cardiovascular status: stable Postop Assessment: no headache, no backache and epidural receding Anesthetic complications: no     Last Vitals:  Vitals:   08/28/19 0009 08/28/19 0427  BP: (!) 99/52 98/63  Pulse: 93 73  Resp: 18 18  Temp: 36.7 C 36.6 C  SpO2: 99% 99%    Last Pain:  Vitals:   08/28/19 0427  TempSrc: Oral  PainSc:                  Karoline Caldwell

## 2019-08-28 NOTE — Lactation Note (Signed)
This note was copied from a baby's chart. Lactation Consultation Note  Patient Name: Alicia Stephens Today's Date: 08/28/2019 Reason for consult: Initial assessment;1st time breastfeeding;Term  LC in to see mom and baby. Mom is G1P1, breastfeeding woman. Mom reports earlier feedings to have gone well, she is slightly tender after cluster feeding this morning and has been given coconut oil. Baby was cuing and mom independently brought baby to breast in cross-cradle with tummy turned in and nose across from nipple. Initially baby's latch was slightly shallow with tucked bottom lip. LC assisted with pulling down on chin, to achieve a deeper latch and flanged bottom lip. LC praised mom for good positioning, and reviewed flanged top/bottom lips to ensure milk transfer.  LC reviewed newborn stomach size, feeding cues, growth spurts/cluster feeding, output expectations, and positioning.  LC discussed tips to keep baby awake and alert during feedings, as baby was falling asleep with LC observing, explaining that this helps ensure adequate transfer each feeding.  LC provided mom with comfort gels for use after this feed and educated on use, cleaning, storage, and not using along with coconut oil. Parents had no questions/concerns at this time. Encouraged to call out with any questions/concerns. LC name/number written on whiteboard.  Maternal Data Formula Feeding for Exclusion: No Has patient been taught Hand Expression?: Yes Does the patient have breastfeeding experience prior to this delivery?: No(first baby)  Feeding Feeding Type: Breast Fed  LATCH Score Latch: Grasps breast easily, tongue down, lips flanged, rhythmical sucking.  Audible Swallowing: A few with stimulation  Type of Nipple: Everted at rest and after stimulation  Comfort (Breast/Nipple): Filling, red/small blisters or bruises, mild/mod discomfort(mom reports sore nipples)  Hold (Positioning): No assistance needed to correctly  position infant at breast.  LATCH Score: 8  Interventions Interventions: Breast feeding basics reviewed;Support pillows;Comfort gels  Lactation Tools Discussed/Used     Consult Status Consult Status: Follow-up Date: 08/28/19 Follow-up type: In-patient    Danford Bad 08/28/2019, 11:41 AM

## 2019-08-29 LAB — CBC
HCT: 35.7 % — ABNORMAL LOW (ref 36.0–46.0)
Hemoglobin: 11.8 g/dL — ABNORMAL LOW (ref 12.0–15.0)
MCH: 30.6 pg (ref 26.0–34.0)
MCHC: 33.1 g/dL (ref 30.0–36.0)
MCV: 92.7 fL (ref 80.0–100.0)
Platelets: 222 10*3/uL (ref 150–400)
RBC: 3.85 MIL/uL — ABNORMAL LOW (ref 3.87–5.11)
RDW: 14.2 % (ref 11.5–15.5)
WBC: 18.6 10*3/uL — ABNORMAL HIGH (ref 4.0–10.5)
nRBC: 0 % (ref 0.0–0.2)

## 2019-08-29 MED ORDER — ACETAMINOPHEN 325 MG PO TABS: 650.0000 mg | ORAL_TABLET | ORAL | 0 refills | Status: DC | PRN

## 2019-08-29 MED ORDER — FERROUS SULFATE 325 (65 FE) MG PO TABS: 325.0000 mg | ORAL_TABLET | Freq: Every day | ORAL | 3 refills | Status: DC

## 2019-08-29 MED ORDER — METRONIDAZOLE 500 MG PO TABS: 500.0000 mg | ORAL_TABLET | Freq: Two times a day (BID) | ORAL | 0 refills | Status: DC

## 2019-08-29 MED ORDER — IBUPROFEN 600 MG PO TABS: 600.0000 mg | ORAL_TABLET | Freq: Four times a day (QID) | ORAL | 0 refills | Status: DC

## 2019-08-29 NOTE — Progress Notes (Signed)
Patient discharged home with infant. Discharge instructions and prescriptions given and reviewed with patient. Patient verbalized understanding. Escorted out by auxillary.  

## 2019-08-29 NOTE — Discharge Summary (Signed)
Obstetric Discharge Summary  Patient ID: Alicia Stephens MRN: [id number] DOB/AGE: 1990/08/09 29 y.o.   Date of Admission: 08/26/2019  Date of Discharge:  08/29/19  Admitting Diagnosis: Induction of labor at [redacted]w[redacted]d  Secondary Diagnosis: Rh positive  Mode of Delivery: normal spontaneous vaginal delivery     Discharge Diagnosis: No other diagnosis   Intrapartum Procedures: Atificial rupture of membranes, pitocin augmentation and Foley bulb, cytotec induction   Post partum procedures: None  Complications: bilateral vaginal lacerations, repaired   Brief Hospital Course   Alicia Stephens is a [id number] who had a SVD on 08/27/2019;  for further details of this birth, please refer to the delivey summary.  Patient had an uncomplicated postpartum course.  By time of discharge on PPD#2, her pain was controlled on oral pain medications; she had appropriate lochia and was ambulating, voiding without difficulty and tolerating regular diet.  She was deemed stable for discharge to home.    Labs: CBC Latest Ref Rng & Units 08/29/2019 08/28/2019 08/26/2019  WBC 4.0 - 10.5 K/uL 18.6(H) 35.1(H) 15.1(H)  Hemoglobin 12.0 - 15.0 g/dL 11.8(L) 12.4 13.8  Hematocrit 36.0 - 46.0 % 35.7(L) 35.5(L) 41.1  Platelets 150 - 400 K/uL 222 224 273   A POS Performed at [facility], [address] [place], [place] [address]   Physical exam:   Temp:  [97.7 F (36.5 C)-99.5 F (37.5 C)] 98 F (36.7 C) (04/29 0400) Pulse Rate:  [75-118] 86 (04/29 0400) Resp:  [16-18] 18 (04/29 0400) BP: (96-129)/(59-74) 111/74 (04/29 0400) SpO2:  [96 %-100 %] 98 % (04/29 0400)  General: alert and no distress  Lochia: appropriate  Abdomen: soft, NT  Uterine Fundus: firm  Lacerations: healing well, no significant drainage, no dehiscence, no significant erythema  Extremities: No evidence of DVT seen on physical exam. No lower extremity edema.  Edinburgh Postnatal Depression Scale Screening Tool  08/28/2019 08/27/2019  I have been able to laugh and see the funny side of things. 0 (No Data)  I have looked forward with enjoyment to things. 0 -  I have blamed myself unnecessarily when things went wrong. 2 -  I have been anxious or worried for no good reason. 3 -  I have felt scared or panicky for no good reason. 2 -  Things have been getting on top of me. 1 -  I have been so unhappy that I have had difficulty sleeping. 1 -  I have felt sad or miserable. 2 -  I have been so unhappy that I have been crying. 1 -  The thought of harming myself has occurred to me. 0 -  Edinburgh Postnatal Depression Scale Total 12 -     Discharge Instructions: Per After Visit Summary.  Activity: Advance as tolerated. Pelvic rest for 6 weeks.  Also refer to After Visit Summary  Diet: Regular  Medications: Allergies as of 08/29/2019   No Known Allergies     Medication List    STOP taking these medications   Evening Primrose Oil 500 MG Caps     TAKE these medications   acetaminophen 325 MG tablet Commonly known as: Tylenol Take 2 tablets (650 mg total) by mouth every 4 (four) hours as needed (for pain scale < 4).   famotidine 10 MG tablet Commonly known as: PEPCID Take 10 mg by mouth 2 (two) times daily.   ferrous sulfate 325 (65 FE) MG tablet Take 1 tablet (325 mg total) by mouth daily with breakfast.   ibuprofen  600 MG tablet Commonly known as: ADVIL Take 1 tablet (600 mg total) by mouth every 6 (six) hours.   lidocaine 5 % ointment Commonly known as: XYLOCAINE Apply 1 application topically as needed.   metroNIDAZOLE 500 MG tablet Commonly known as: FLAGYL Take 1 tablet (500 mg total) by mouth every 12 (twelve) hours.   Vitafol FE+ 90-0.6-0.4-200 MG Caps Take 1 tablet by mouth daily.      Outpatient follow up:  Follow-up Information    Doreene Burke, CNM Follow up.   Specialties: Certified Nurse Midwife, Radiology Why: Someone from the office will call you to schedule two  (2) week televisit and six (6) week postpartum visit.  Contact information: [address] [place] [address] [phone]          Postpartum contraception: Will discuss further at postpartum visit  Discharged Condition: stable  Discharged to: home   Newborn Data:  Disposition:home with mother  Apgars: APGAR (1 MIN): 7   APGAR (5 MINS): 9    Baby Feeding: Breast   Gunnar Bulla, CNM [facility], [facility] 08/29/19 6:51 AM

## 2019-09-10 ENCOUNTER — Ambulatory Visit (INDEPENDENT_AMBULATORY_CARE_PROVIDER_SITE_OTHER): Payer: Medicaid Other | Admitting: Certified Nurse Midwife

## 2019-09-10 ENCOUNTER — Other Ambulatory Visit: Payer: Self-pay

## 2019-09-10 ENCOUNTER — Encounter: Payer: Self-pay | Admitting: Certified Nurse Midwife

## 2019-09-10 DIAGNOSIS — Z1331 Encounter for screening for depression: Secondary | ICD-10-CM | POA: Diagnosis not present

## 2019-09-10 DIAGNOSIS — H5213 Myopia, bilateral: Secondary | ICD-10-CM | POA: Diagnosis not present

## 2019-09-10 NOTE — Patient Instructions (Signed)

## 2019-09-10 NOTE — Progress Notes (Signed)
Virtual Visit via Telephone Note  I connected with Alicia Stephens on 09/10/19 at 10:45 AM EDT by telephone and verified that I am speaking with the correct person using two identifiers.   I discussed the limitations, risks, security and privacy concerns of performing an evaluation and management service by telephone and the availability of in person appointments. I also discussed with the patient that there may be a patient responsible charge related to this service. The patient expressed understanding and agreed to proceed. Present on phone: Patient @ home [facility], at office Doreene Burke, CNM at office   History of Present Illness: 29 yr old 2 wk post SVD    Observations/Objective: 2 week post partum Mood check. Doing well. State baby is nursing well. Getting up 2 times at night. Has minimal bleeding and is using motrin occasionally. She states she is coping well.   PHQ9 SCORE ONLY 09/10/2019  Score 0     Assessment and Plan: Follow up in 4 wks for ppv or soon as needed.     I discussed the assessment and treatment plan with the patient. The patient was provided an opportunity to ask questions and all were answered. The patient agreed with the plan and demonstrated an understanding of the instructions.   The patient was advised to call back or seek an in-person evaluation if the symptoms worsen or if the condition fails to improve as anticipated.  I provided 9 minutes of non-face-to-face time during this encounter.   Doreene Burke, CNM

## 2019-09-10 NOTE — Progress Notes (Signed)
Received transferred call from [place] for a televisit. DOB used as identifier. Patient states she is doing well. States she is breast feeding. Her menstrual flow is very light. PhQ9=0. Call transferred to [facility] CNM.

## 2019-10-09 ENCOUNTER — Encounter: Payer: Self-pay | Admitting: Certified Nurse Midwife

## 2019-10-09 ENCOUNTER — Other Ambulatory Visit: Payer: Self-pay

## 2019-10-09 ENCOUNTER — Ambulatory Visit (INDEPENDENT_AMBULATORY_CARE_PROVIDER_SITE_OTHER): Payer: Medicaid Other | Admitting: Certified Nurse Midwife

## 2019-10-09 MED ORDER — NORETHINDRONE 0.35 MG PO TABS: 1.0000 | ORAL_TABLET | Freq: Every day | ORAL | 11 refills | Status: DC

## 2019-10-09 NOTE — Patient Instructions (Signed)
Preventive Care 21-29 Years Old, Female Preventive care refers to visits with your health care provider and lifestyle choices that can promote health and wellness. This includes:  A yearly physical exam. This may also be called an annual well check.  Regular dental visits and eye exams.  Immunizations.  Screening for certain conditions.  Healthy lifestyle choices, such as eating a healthy diet, getting regular exercise, not using drugs or products that contain nicotine and tobacco, and limiting alcohol use. What can I expect for my preventive care visit? Physical exam Your health care provider will check your:  Height and weight. This may be used to calculate body mass index (BMI), which tells if you are at a healthy weight.  Heart rate and blood pressure.  Skin for abnormal spots. Counseling Your health care provider may ask you questions about your:  Alcohol, tobacco, and drug use.  Emotional well-being.  Home and relationship well-being.  Sexual activity.  Eating habits.  Work and work environment.  Method of birth control.  Menstrual cycle.  Pregnancy history. What immunizations do I need?  Influenza (flu) vaccine  This is recommended every year. Tetanus, diphtheria, and pertussis (Tdap) vaccine  You may need a Td booster every 10 years. Varicella (chickenpox) vaccine  You may need this if you have not been vaccinated. Human papillomavirus (HPV) vaccine  If recommended by your health care provider, you may need three doses over 6 months. Measles, mumps, and rubella (MMR) vaccine  You may need at least one dose of MMR. You may also need a second dose. Meningococcal conjugate (MenACWY) vaccine  One dose is recommended if you are age 19-21 years and a first-year college student living in a residence hall, or if you have one of several medical conditions. You may also need additional booster doses. Pneumococcal conjugate (PCV13) vaccine  You may need  this if you have certain conditions and were not previously vaccinated. Pneumococcal polysaccharide (PPSV23) vaccine  You may need one or two doses if you smoke cigarettes or if you have certain conditions. Hepatitis A vaccine  You may need this if you have certain conditions or if you travel or work in places where you may be exposed to hepatitis A. Hepatitis B vaccine  You may need this if you have certain conditions or if you travel or work in places where you may be exposed to hepatitis B. Haemophilus influenzae type b (Hib) vaccine  You may need this if you have certain conditions. You may receive vaccines as individual doses or as more than one vaccine together in one shot (combination vaccines). Talk with your health care provider about the risks and benefits of combination vaccines. What tests do I need?  Blood tests  Lipid and cholesterol levels. These may be checked every 5 years starting at age 20.  Hepatitis C test.  Hepatitis B test. Screening  Diabetes screening. This is done by checking your blood sugar (glucose) after you have not eaten for a while (fasting).  Sexually transmitted disease (STD) testing.  BRCA-related cancer screening. This may be done if you have a family history of breast, ovarian, tubal, or peritoneal cancers.  Pelvic exam and Pap test. This may be done every 3 years starting at age 21. Starting at age 30, this may be done every 5 years if you have a Pap test in combination with an HPV test. Talk with your health care provider about your test results, treatment options, and if necessary, the need for more tests.   Follow these instructions at home: Eating and drinking   Eat a diet that includes fresh fruits and vegetables, whole grains, lean protein, and low-fat dairy.  Take vitamin and mineral supplements as recommended by your health care provider.  Do not drink alcohol if: ? Your health care provider tells you not to drink. ? You are  pregnant, may be pregnant, or are planning to become pregnant.  If you drink alcohol: ? Limit how much you have to 0-1 drink a day. ? Be aware of how much alcohol is in your drink. In the [place] one drink equals one 12 oz bottle of beer (355 mL), one 5 oz glass of wine (148 mL), or one 1 oz glass of hard liquor (44 mL). Lifestyle  Take daily care of your teeth and gums.  Stay active. Exercise for at least 30 minutes on 5 or more days each week.  Do not use any products that contain nicotine or tobacco, such as cigarettes, e-cigarettes, and chewing tobacco. If you need help quitting, ask your health care provider.  If you are sexually active, practice safe sex. Use a condom or other form of birth control (contraception) in order to prevent pregnancy and STIs (sexually transmitted infections). If you plan to become pregnant, see your health care provider for a preconception visit. What's next?  Visit your health care provider once a year for a well check visit.  Ask your health care provider how often you should have your eyes and teeth checked.  Stay up to date on all vaccines. This information is not intended to replace advice given to you by your health care provider. Make sure you discuss any questions you have with your health care provider. Document Revised: 12/28/2017 Document Reviewed: 12/28/2017 Elsevier Patient Education  2020 [employer].

## 2019-10-09 NOTE — Progress Notes (Signed)
Subjective:    Alicia Stephens is a 29 y.o. G96P1001 Caucasian female who presents for a postpartum visit. She is 6 weeks postpartum following a spontaneous vaginal delivery at 40.6 gestational weeks. Anesthesia: epidural. I have fully reviewed the prenatal and intrapartum course. Postpartum course has been WNL Baby's course has been WNL. Baby is feeding by breast. Bleeding no bleeding. Bowel function is normal. Bladder function is normal. Patient is not sexually active.. Contraception method is oral progesterone-only contraceptive. Postpartum depression screening: negative. Score 0.  Last pap 02/28/19 and was negative.  The following portions of the patient's history were reviewed and updated as appropriate: allergies, current medications, past medical history, past surgical history and problem list.  Review of Systems Pertinent items are noted in HPI.   Vitals:   10/09/19 1418  BP: (!) 101/50  Pulse: (!) 58  Weight: 179 lb (81.2 kg)  Height: 5\' 10"  (1.778 m)   No LMP recorded.  Objective:   General:  alert, cooperative and no distress   Breasts:  deferred, no complaints  Lungs: clear to auscultation bilaterally  Heart:  regular rate and rhythm  Abdomen: soft, nontender   Vulva: normal  Vagina: normal vagina  Cervix:  closed  Corpus: Well-involuted  Adnexa:  Non-palpable  Rectal Exam: No hemorrhoids        Assessment:   Postpartum exam 6  wks s/p SVD Breastfeeding Depression screening Contraception counseling   Plan:  : oral progesterone-only contraceptive Follow up in: 1 yr for annual exam or earlier if needed  , CNM

## 2019-12-26 DIAGNOSIS — Z20822 Contact with and (suspected) exposure to covid-19: Secondary | ICD-10-CM | POA: Diagnosis not present

## 2019-12-26 DIAGNOSIS — R519 Headache, unspecified: Secondary | ICD-10-CM | POA: Diagnosis not present

## 2019-12-26 DIAGNOSIS — Z03818 Encounter for observation for suspected exposure to other biological agents ruled out: Secondary | ICD-10-CM | POA: Diagnosis not present

## 2020-09-13 ENCOUNTER — Ambulatory Visit (INDEPENDENT_AMBULATORY_CARE_PROVIDER_SITE_OTHER): Payer: Medicaid Other

## 2020-09-13 ENCOUNTER — Encounter: Payer: Self-pay | Admitting: Emergency Medicine

## 2020-09-13 ENCOUNTER — Ambulatory Visit
Admission: EM | Admit: 2020-09-13 | Discharge: 2020-09-13 | Disposition: A | Discharge status: Home / Self Care | Payer: Medicaid Other | Attending: Physician Assistant | Admitting: Physician Assistant

## 2020-09-13 ENCOUNTER — Other Ambulatory Visit: Payer: Self-pay

## 2020-09-13 DIAGNOSIS — A084 Viral intestinal infection, unspecified: Secondary | ICD-10-CM | POA: Insufficient documentation

## 2020-09-13 DIAGNOSIS — R0602 Shortness of breath: Secondary | ICD-10-CM

## 2020-09-13 DIAGNOSIS — R5383 Other fatigue: Secondary | ICD-10-CM | POA: Diagnosis not present

## 2020-09-13 DIAGNOSIS — E860 Dehydration: Secondary | ICD-10-CM | POA: Diagnosis not present

## 2020-09-13 DIAGNOSIS — R531 Weakness: Secondary | ICD-10-CM | POA: Diagnosis not present

## 2020-09-13 LAB — URINALYSIS, COMPLETE (UACMP) WITH MICROSCOPIC
Bilirubin Urine: NEGATIVE
Glucose, UA: NEGATIVE mg/dL
Ketones, ur: NEGATIVE mg/dL
Leukocytes,Ua: NEGATIVE
Nitrite: NEGATIVE
Protein, ur: NEGATIVE mg/dL
Specific Gravity, Urine: 1.02 (ref 1.005–1.030)
pH: 8.5 — ABNORMAL HIGH (ref 5.0–8.0)

## 2020-09-13 NOTE — Discharge Instructions (Addendum)
The urinalysis shows that you are mildly dehydrated.  This is nothing severe.  You should be able to continue to push fluids and Pedialyte at home and rest for couple more days and should be feeling like yourself in the next couple of days.  Encourage you to eat something.  Chest x-ray was performed today due to the complaint of shortness of breath.  Chest x-ray is normal.  All your vital signs are also normal and reassuring.  I believe the shortness of breath is likely due to your general fatigue and anxiety about being ill.  If your shortness of breath worsens or you feeling more weak or fatigued then call 911 or go to the ED.  Just give it a few more days and really push your fluids and rest and he should be feeling better soon.

## 2020-09-13 NOTE — ED Provider Notes (Signed)
[facility]    CSN: [id number] Arrival date & time: 09/13/20  1409      History   Chief Complaint Chief Complaint  Patient presents with  . Weakness  . Fatigue    HPI Alicia Stephens is a 30 y.o. female presenting for feeling weak and fatigued over the past 4 days.  Patient says that 4 days ago she had a stomach virus which she had continuous vomiting and diarrhea over 24-hour period of time.  She says those symptoms of diarrhea and vomiting have resolved and she has been able to drink water and eat normally again.  She said her appetite is decreased.  She denies any continued nausea, but says she does not feel like she has her strength back.  Patient says she started to feel very poorly last night and became very anxious and then started to feel short of breath.  She says she still feels like she cannot get a good deep breath at this time.  Denies any pain in her chest.  Has not any coughing, congestion or sore throat.  Denies any abdominal pain.  Patient says occasionally she will feel a little lightheaded but denies that now.  Patient says she has been drinking Pedialyte and person Pedialyte on her way to the urgent care today.  She says she has not eaten lunch though in the past day or so.  Patient concerned because she says she feels like she is dehydrated.  Patient says her partner had similar symptoms and so did their child.  No other complaints.  HPI  Past Medical History:  Diagnosis Date  . Breast mass     Patient Active Problem List   Diagnosis Date Noted  . Labor and delivery, indication for care 08/26/2019    History reviewed. No pertinent surgical history.  OB History    Gravida  1   Para  1   Term  1   Preterm      AB      Living  1     SAB      IAB      Ectopic      Multiple  0   Live Births  1            Home Medications    Prior to Admission medications   Medication Sig Start Date End Date Taking? Authorizing Provider   ferrous sulfate 325 (65 FE) MG tablet Take 1 tablet (325 mg total) by mouth daily with breakfast. 08/29/19  Yes Lawhorn, Vanessa Jennings, CNM  acetaminophen (TYLENOL) 325 MG tablet Take 2 tablets (650 mg total) by mouth every 4 (four) hours as needed (for pain scale < 4). 08/29/19   Lawhorn, Vanessa Thibodaux, CNM  famotidine (PEPCID) 10 MG tablet Take 10 mg by mouth 2 (two) times daily.    [provider]  ibuprofen (ADVIL) 600 MG tablet Take 1 tablet (600 mg total) by mouth every 6 (six) hours. 08/29/19   Lawhorn, Vanessa Hurst, CNM  lidocaine (XYLOCAINE) 5 % ointment Apply 1 application topically as needed. Patient not taking: Reported on 09/10/2019 06/25/19   Merrilee Jansky, MD  metroNIDAZOLE (FLAGYL) 500 MG tablet Take 1 tablet (500 mg total) by mouth every 12 (twelve) hours. Patient not taking: No sig reported 08/29/19   Gunnar Bulla, CNM  norethindrone (MICRONOR) 0.35 MG tablet Take 1 tablet (0.35 mg total) by mouth daily. 10/09/19   Doreene Burke, CNM  Prenat-Fe Poly-Methfol-FA-DHA (VITAFOL FE+) 90-0.6-0.4-200  MG CAPS Take 1 tablet by mouth daily. 05/28/19   Doreene Burke, CNM    Family History Family History  Problem Relation Age of Onset  . Breast cancer Paternal Grandmother   . Diabetes Other     Social History Social History   Tobacco Use  . Smoking status: Never Smoker  . Smokeless tobacco: Never Used  Vaping Use  . Vaping Use: Never used  Substance Use Topics  . Alcohol use: Not Currently  . Drug use: Not Currently    Types: Marijuana     Allergies   Patient has no known allergies.   Review of Systems Review of Systems  Constitutional: Positive for appetite change and fatigue. Negative for chills, diaphoresis and fever.  HENT: Negative for congestion, ear pain, rhinorrhea, sinus pressure, sinus pain and sore throat.   Respiratory: Positive for shortness of breath. Negative for cough.   Gastrointestinal: Negative for abdominal pain,  nausea and vomiting.  Musculoskeletal: Negative for arthralgias and myalgias.  Skin: Negative for rash.  Neurological: Positive for weakness. Negative for headaches.  Hematological: Negative for adenopathy.     Physical Exam Triage Vital Signs ED Triage Vitals  Enc Vitals Group     BP 09/13/20 1448 132/86     Pulse Rate 09/13/20 1448 65     Resp 09/13/20 1448 18     Temp 09/13/20 1448 98.6 F (37 C)     Temp Source 09/13/20 1448 Oral     SpO2 09/13/20 1448 100 %     Weight 09/13/20 1449 179 lb 0.2 oz (81.2 kg)     Height 09/13/20 1449 5\' 10"  (1.778 m)     Head Circumference --      Peak Flow --      Pain Score 09/13/20 1449 2     Pain Loc --      Pain Edu? --      Excl. in GC? --    No data found.  Updated Vital Signs BP 132/86 (BP Location: Right Arm)   Pulse 65   Temp 98.6 F (37 C) (Oral)   Resp 18   Ht 5\' 10"  (1.778 m)   Wt 179 lb 0.2 oz (81.2 kg)   LMP 08/21/2020 (Approximate)   SpO2 100%   Breastfeeding No   BMI 25.69 kg/m       Physical Exam Vitals and nursing note reviewed.  Constitutional:      General: She is not in acute distress.    Appearance: Normal appearance. She is not ill-appearing or toxic-appearing.  HENT:     Head: Normocephalic and atraumatic.     Nose: Nose normal.     Mouth/Throat:     Mouth: Mucous membranes are moist.     Pharynx: Oropharynx is clear.  Eyes:     General: No scleral icterus.       Right eye: No discharge.        Left eye: No discharge.     Conjunctiva/sclera: Conjunctivae normal.  Cardiovascular:     Rate and Rhythm: Normal rate and regular rhythm.     Heart sounds: Normal heart sounds.  Pulmonary:     Effort: Pulmonary effort is normal. No respiratory distress.     Breath sounds: Normal breath sounds.  Abdominal:     Palpations: Abdomen is soft.     Tenderness: There is no abdominal tenderness.  Musculoskeletal:     Cervical back: Neck supple.  Skin:    General: Skin is dry.  Neurological:  General: No focal deficit present.     Mental Status: She is alert. Mental status is at baseline.     Motor: No weakness.     Gait: Gait normal.  Psychiatric:        Mood and Affect: Mood normal.        Behavior: Behavior normal.        Thought Content: Thought content normal.      UC Treatments / Results  Labs (all labs ordered are listed, but only abnormal results are displayed) Labs Reviewed  URINALYSIS, COMPLETE (UACMP) WITH MICROSCOPIC - Abnormal; Notable for the following components:      Result Value   APPearance HAZY (*)    pH 8.5 (*)    Hgb urine dipstick TRACE (*)    Bacteria, UA FEW (*)    All other components within normal limits    EKG   Radiology DG Chest 2 View  Result Date: 09/13/2020 CLINICAL DATA:  Pt c/o weakness, fatigue, and shortness of breath. EXAM: CHEST - 2 VIEW COMPARISON:  None. FINDINGS: The cardiomediastinal contours are within normal limits. The lungs are clear. No pneumothorax or pleural effusion. No acute finding in the visualized skeleton. IMPRESSION: No acute cardiopulmonary process. Electronically Signed   By: Emmaline Kluver M.D.   On: 09/13/2020 15:39    Procedures Procedures (including critical care time)  Medications Ordered in UC Medications - No data to display  Initial Impression / Assessment and Plan / UC Course  I have reviewed the triage vital signs and the nursing notes.  Pertinent labs & imaging results that were available during my care of the patient were reviewed by me and considered in my medical decision making (see chart for details).   30 year old female presenting for weakness, fatigue and feeling short of breath over the past 4 days.  Previously had multiple episodes of vomiting and diarrhea which has since resolved.  Able to tolerate fluids and food without vomiting or diarrhea at this point.  Vital signs are all stable.  Blood pressure is 132/86.  She is afebrile.  Oxygen is 100%.  Overall, she is well-appearing  on exam.  Exam is benign.  Urinalysis obtained today to assess hydration status.  UA shows specific gravity of 1.020.  Hazy appearance.  Chest x-ray obtained due to complaint of shortness of breath.  CXR independently viewed by me.  Overread confirms normal images.  Reviewed this with patient.  Clinically she looks well and her vital signs reflect that.  Patient tolerating fluids at this time.  I believe she is safe to go home and continue to push water and Pedialyte.  Advised her that I will take her a few more days for her to get her strength back and she should just rest and work on rehydration and trying to eat.  Reviewed ED precautions with patient.   Final Clinical Impressions(s) / UC Diagnoses   Final diagnoses:  Fatigue, unspecified type  Viral gastroenteritis  Mild dehydration  Shortness of breath     Discharge Instructions     The urinalysis shows that you are mildly dehydrated.  This is nothing severe.  You should be able to continue to push fluids and Pedialyte at home and rest for couple more days and should be feeling like yourself in the next couple of days.  Encourage you to eat something.  Chest x-ray was performed today due to the complaint of shortness of breath.  Chest x-ray is normal.  All your vital  signs are also normal and reassuring.  I believe the shortness of breath is likely due to your general fatigue and anxiety about being ill.  If your shortness of breath worsens or you feeling more weak or fatigued then call 911 or go to the ED.  Just give it a few more days and really push your fluids and rest and he should be feeling better soon.    ED Prescriptions    None     PDMP not reviewed this encounter.   Shirlee Latchaves, Nayleah Gamel B, PA-C 09/13/20 1546

## 2020-09-13 NOTE — ED Triage Notes (Addendum)
Pt c/o weakness, fatigue, and shortness of breath. Started after having a stomach bug. She states she had a stomach about 4 days ago and has not gotten her strength back. She states her vomiting and diarrhea have resolved. She state she has been drinking lots of fluids. She states she feels dehydrated. Pt states she took a home covid test ans was negative. She declines covid testing at this time.

## 2020-10-12 ENCOUNTER — Encounter: Payer: Medicaid Other | Admitting: Certified Nurse Midwife

## 2020-10-13 ENCOUNTER — Other Ambulatory Visit: Payer: Self-pay

## 2020-10-13 ENCOUNTER — Ambulatory Visit (INDEPENDENT_AMBULATORY_CARE_PROVIDER_SITE_OTHER): Payer: Medicaid Other | Admitting: Certified Nurse Midwife

## 2020-10-13 ENCOUNTER — Encounter: Payer: Self-pay | Admitting: Certified Nurse Midwife

## 2020-10-13 VITALS — BP 117/76 | HR 91 | Ht 70.0 in | Wt 167.9 lb

## 2020-10-13 DIAGNOSIS — N6452 Nipple discharge: Secondary | ICD-10-CM | POA: Diagnosis not present

## 2020-10-13 NOTE — Progress Notes (Signed)
GYN ENCOUNTER NOTE  Subjective:       Alicia Stephens is a 30 y.o. G28P1001 female is here for gynecologic evaluation of the following issues:  1. Pt states she has nipple discharge and stopped nursing in December or January. She state she was messaging her breast due to a dull ache that she was experiencing and noticed discharge when squeezing the nipples. She denies fever, masses, or tenderness to either breasts.   Gynecologic History Patient's last menstrual period was 09/18/2020. Contraception: oral progesterone-only contraceptive Last Pap: 02/28/19. Results were: normal Last mammogram: n/a .   Obstetric History OB History  Gravida Para Term Preterm AB Living  1 1 1     1   SAB IAB Ectopic Multiple Live Births        0 1    # Outcome Date GA Lbr Len/2nd Weight Sex Delivery Anes PTL Lv  1 Term 08/27/19 [redacted]w[redacted]d  8 lb 9.9 oz (3.91 kg) F Vag-Spont EPI  LIV    Past Medical History:  Diagnosis Date   Breast mass     No past surgical history on file.  Current Outpatient Medications on File Prior to Visit  Medication Sig Dispense Refill   acetaminophen (TYLENOL) 325 MG tablet Take 2 tablets (650 mg total) by mouth every 4 (four) hours as needed (for pain scale < 4). 60 tablet 0   famotidine (PEPCID) 10 MG tablet Take 10 mg by mouth 2 (two) times daily.     ferrous sulfate 325 (65 FE) MG tablet Take 1 tablet (325 mg total) by mouth daily with breakfast. 30 tablet 3   ibuprofen (ADVIL) 600 MG tablet Take 1 tablet (600 mg total) by mouth every 6 (six) hours. 30 tablet 0   lidocaine (XYLOCAINE) 5 % ointment Apply 1 application topically as needed. (Patient not taking: Reported on 09/10/2019) 35.44 g 0   metroNIDAZOLE (FLAGYL) 500 MG tablet Take 1 tablet (500 mg total) by mouth every 12 (twelve) hours. (Patient not taking: No sig reported) 12 tablet 0   norethindrone (MICRONOR) 0.35 MG tablet Take 1 tablet (0.35 mg total) by mouth daily. 1 Package 11   Prenat-Fe Poly-Methfol-FA-DHA  (VITAFOL FE+) 90-0.6-0.4-200 MG CAPS Take 1 tablet by mouth daily. 30 capsule 9   No current facility-administered medications on file prior to visit.    No Known Allergies  Social History   Socioeconomic History   Marital status: Single    Spouse name: Not on file   Number of children: Not on file   Years of education: Not on file   Highest education level: Not on file  Occupational History   Not on file  Tobacco Use   Smoking status: Never   Smokeless tobacco: Never  Vaping Use   Vaping Use: Never used  Substance and Sexual Activity   Alcohol use: Not Currently   Drug use: Not Currently    Types: Marijuana   Sexual activity: Yes  Other Topics Concern   Not on file  Social History Narrative   Not on file   Social Determinants of Health   Financial Resource Strain: Not on file  Food Insecurity: Not on file  Transportation Needs: Not on file  Physical Activity: Not on file  Stress: Not on file  Social Connections: Not on file  Intimate Partner Violence: Not on file    Family History  Problem Relation Age of Onset   Breast cancer Paternal Grandmother    Diabetes Other     The  following portions of the patient's history were reviewed and updated as appropriate: allergies, current medications, past family history, past medical history, past social history, past surgical history and problem list.  Review of Systems Review of Systems - Negative except as mentioned in HPI Review of Systems - General ROS: negative for - chills, fatigue, fever, hot flashes, malaise or night sweats Hematological and Lymphatic ROS: negative for - bleeding problems or swollen lymph nodes Gastrointestinal ROS: negative for - abdominal pain, blood in stools, change in bowel habits and nausea/vomiting Musculoskeletal ROS: negative for - joint pain, muscle pain or muscular weakness Genito-Urinary ROS: negative for - change in menstrual cycle, dysmenorrhea, dyspareunia, dysuria, genital  discharge, genital ulcers, hematuria, incontinence, irregular/heavy menses, nocturia or pelvic painjj  Objective:   BP 117/76   Pulse 91   Ht 5\' 10"  (1.778 m)   Wt 167 lb 14.4 oz (76.2 kg)   LMP 09/18/2020   Breastfeeding No   BMI 24.09 kg/m  CONSTITUTIONAL: Well-developed, well-nourished female in no acute distress.  HENT:  Normocephalic, atraumatic.  NECK: Normal range of motion, supple, no masses.  Normal thyroid.  SKIN: Skin is warm and dry. No rash noted. Not diaphoretic. No erythema. No pallor. NEUROLGIC: Alert and oriented to person, place, and time. PSYCHIATRIC: Normal mood and affect. Normal behavior. Normal judgment and thought content. CARDIOVASCULAR:Not Examined RESPIRATORY: Not Examined BREASTS: Breasts: breasts appear normal, no suspicious masses, no skin or nipple changes or axillary nodes. ABDOMEN: Soft, non distended; Non tender.  No Organomegaly. PELVIC:not indicated MUSCULOSKELETAL: Normal range of motion. No tenderness.  No cyanosis, clubbing, or edema.     Assessment:   Nipple discharge   Plan:   Reassurance given . Discussed that breast can have discharge up to 1 yr post lactation and that with stimulation of breast that she is encouraging production. Pt encouraged to not stimulate the breast and to use tylenol or motrin as needed if she has the dull ache in the near future. Red flag symptoms reviewed. Follow up as scheduled for annual exam.   09/20/2020, CNM

## 2020-10-30 ENCOUNTER — Encounter: Payer: Self-pay | Admitting: Certified Nurse Midwife

## 2020-11-04 ENCOUNTER — Encounter: Payer: Self-pay | Admitting: Certified Nurse Midwife

## 2020-11-04 ENCOUNTER — Ambulatory Visit (INDEPENDENT_AMBULATORY_CARE_PROVIDER_SITE_OTHER): Payer: Medicaid Other | Admitting: Certified Nurse Midwife

## 2020-11-04 ENCOUNTER — Other Ambulatory Visit: Payer: Self-pay

## 2020-11-04 VITALS — BP 105/71 | HR 85 | Ht 69.0 in | Wt 173.7 lb

## 2020-11-04 DIAGNOSIS — Z01419 Encounter for gynecological examination (general) (routine) without abnormal findings: Secondary | ICD-10-CM

## 2020-11-04 NOTE — Progress Notes (Signed)
GYNECOLOGY ANNUAL PREVENTATIVE CARE ENCOUNTER NOTE  History:     Alicia Stephens is a 30 y.o. G104P1001 female here for a routine annual gynecologic exam.  Current complaints: none.   Denies abnormal vaginal bleeding, discharge, pelvic pain, problems with intercourse or other gynecologic concerns.     Social Relationship: married Living: spouse and children Work: stay at home mom Exercise: 3 times a week  Smoke/Alcohol/drug use: rare alcohol use, denies drugs and smoking.   Gynecologic History Patient's last menstrual period was 10/19/2020 (approximate). Contraception:  natural family planning  Last Pap: 02/28/2019. Results were: normal  Last mammogram: n/a.    The pregnancy intention screening data noted above was reviewed. Potential methods of contraception were discussed. The patient elected to proceed with No Method - Other Reason.    Obstetric History OB History  Gravida Para Term Preterm AB Living  1 1 1     1   SAB IAB Ectopic Multiple Live Births        0 1    # Outcome Date GA Lbr Len/2nd Weight Sex Delivery Anes PTL Lv  1 Term 08/27/19 [redacted]w[redacted]d  8 lb 9.9 oz (3.91 kg) F Vag-Spont EPI  LIV    Past Medical History:  Diagnosis Date   Breast mass     No past surgical history on file.  Current Outpatient Medications on File Prior to Visit  Medication Sig Dispense Refill   Multiple Vitamin (MULTIVITAMIN) tablet Take 1 tablet by mouth daily.     acetaminophen (TYLENOL) 325 MG tablet Take 2 tablets (650 mg total) by mouth every 4 (four) hours as needed (for pain scale < 4). (Patient not taking: Reported on 11/04/2020) 60 tablet 0   amoxicillin-clarithromycin-lansoprazole (PREVPAC) combo pack Take by mouth 2 (two) times daily. Follow package directions. (Patient not taking: Reported on 11/04/2020)     famotidine (PEPCID) 10 MG tablet Take 10 mg by mouth 2 (two) times daily. (Patient not taking: No sig reported)     ferrous sulfate 325 (65 FE) MG tablet Take 1 tablet  (325 mg total) by mouth daily with breakfast. (Patient not taking: Reported on 11/04/2020) 30 tablet 3   ibuprofen (ADVIL) 600 MG tablet Take 1 tablet (600 mg total) by mouth every 6 (six) hours. (Patient not taking: Reported on 11/04/2020) 30 tablet 0   lidocaine (XYLOCAINE) 5 % ointment Apply 1 application topically as needed. (Patient not taking: No sig reported) 35.44 g 0   metroNIDAZOLE (FLAGYL) 500 MG tablet Take 1 tablet (500 mg total) by mouth every 12 (twelve) hours. (Patient not taking: No sig reported) 12 tablet 0   norethindrone (MICRONOR) 0.35 MG tablet Take 1 tablet (0.35 mg total) by mouth daily. (Patient not taking: No sig reported) 1 Package 11   Prenat-Fe Poly-Methfol-FA-DHA (VITAFOL FE+) 90-0.6-0.4-200 MG CAPS Take 1 tablet by mouth daily. (Patient not taking: No sig reported) 30 capsule 9   No current facility-administered medications on file prior to visit.    No Known Allergies  Social History:  reports that she has never smoked. She has never used smokeless tobacco. She reports previous alcohol use. She reports previous drug use. Drug: Marijuana.  Family History  Problem Relation Age of Onset   Breast cancer Paternal Grandmother    Diabetes Other     The following portions of the patient's history were reviewed and updated as appropriate: allergies, current medications, past family history, past medical history, past social history, past surgical history and problem list.  Review of Systems Pertinent items noted in HPI and remainder of comprehensive ROS otherwise negative.  Physical Exam:  BP 105/71   Pulse 85   Ht 5\' 9"  (1.753 m)   Wt 173 lb 11.2 oz (78.8 kg)   LMP 10/19/2020 (Approximate)   Breastfeeding No   BMI 25.65 kg/m  CONSTITUTIONAL: Well-developed, well-nourished female in no acute distress.  HENT:  Normocephalic, atraumatic, External right and left ear normal. Oropharynx is clear and moist EYES: Conjunctivae and EOM are normal. Pupils are equal,  round, and reactive to light. No scleral icterus.  NECK: Normal range of motion, supple, no masses.  Normal thyroid.  SKIN: Skin is warm and dry. No rash noted. Not diaphoretic. No erythema. No pallor. MUSCULOSKELETAL: Normal range of motion. No tenderness.  No cyanosis, clubbing, or edema.  2+ distal pulses. NEUROLOGIC: Alert and oriented to person, place, and time. Normal reflexes, muscle tone coordination.  PSYCHIATRIC: Normal mood and affect. Normal behavior. Normal judgment and thought content. CARDIOVASCULAR: Normal heart rate noted, regular rhythm RESPIRATORY: Clear to auscultation bilaterally. Effort and breath sounds normal, no problems with respiration noted. BREASTS: Symmetric in size. No masses, tenderness, skin changes, nipple drainage, or lymphadenopathy bilaterally.  ABDOMEN: Soft, no distention noted.  No tenderness, rebound or guarding.  PELVIC: Normal appearing external genitalia and urethral meatus; normal appearing vaginal mucosa and cervix.  No abnormal discharge noted.  Pap smear not done.   Normal uterine size, no other palpable masses, no uterine or adnexal tenderness.  .   Assessment and Plan:      Women's annual routine gynecological examination  Pap: not due  Mammogram : n/a  Labs: declines  Refills: none Referral: none  Routine preventative health maintenance measures emphasized. Please refer to After Visit Summary for other counseling recommendations.      10/21/2020, CNM [facility],  [facility]

## 2021-05-02 NOTE — L&D Delivery Note (Signed)
       Delivery Note   Alicia Stephens is a 31 y.o. [id number] at [redacted]w[redacted]d Estimated Date of Delivery: 01/07/22  PRE-OPERATIVE DIAGNOSIS:  1) [redacted]w[redacted]d pregnancy.    POST-OPERATIVE DIAGNOSIS:  1) [redacted]w[redacted]d pregnancy s/p Vaginal, Spontaneous    Delivery Type: Vaginal, Spontaneous    Delivery Anesthesia: Epidural   Labor Complications:  none    ESTIMATED BLOOD LOSS: 200 ml    FINDINGS:   1) female infant, Apgar scores of   9 at 1 minute and    9 at 5 minutes and a birthweight pending , infant skin to skin.    2) Nuchal cord:   SPECIMENS:   PLACENTA:   Appearance:  intact, 3 vessel cord   Removal:    spontaneous    Disposition:  per protocol   DISPOSITION:  Infant to left in stable condition in the delivery room, with L&D personnel and mother,  NARRATIVE SUMMARY: Labor course:  Ms. Alicia Stephens is a [id number] at [redacted]w[redacted]d who presented for labor management.  She progressed well in labor without pitocin.  She received the appropriate anesthesia and proceeded to complete dilation. She evidenced good maternal expulsive effort during the second stage. She went on to deliver a viable female infant "Alicia Stephens". The placenta delivered without problems and was noted to be complete. A perineal and vaginal examination was performed. Episiotomy/Lacerations:  intact, periurethral abrasion, hemostatic no repair needed. The patient tolerated this well.  Pattricia Boss Zeb Rawl,CNM  01/11/2022 1:29 PM

## 2021-06-01 ENCOUNTER — Ambulatory Visit (INDEPENDENT_AMBULATORY_CARE_PROVIDER_SITE_OTHER): Payer: Medicaid Other | Admitting: Certified Nurse Midwife

## 2021-06-01 ENCOUNTER — Encounter: Payer: Self-pay | Admitting: Certified Nurse Midwife

## 2021-06-01 ENCOUNTER — Other Ambulatory Visit: Payer: Self-pay

## 2021-06-01 VITALS — BP 128/82 | HR 94 | Ht 70.0 in | Wt 177.0 lb

## 2021-06-01 DIAGNOSIS — Z3200 Encounter for pregnancy test, result unknown: Secondary | ICD-10-CM | POA: Diagnosis not present

## 2021-06-01 LAB — POCT URINE PREGNANCY: Preg Test, Ur: POSITIVE — AB

## 2021-06-01 MED ORDER — DOXYLAMINE-PYRIDOXINE 10-10 MG PO TBEC: 1.0000 | DELAYED_RELEASE_TABLET | Freq: Four times a day (QID) | ORAL | 5 refills | Status: DC

## 2021-06-01 NOTE — Patient Instructions (Signed)
Prenatal Care ?Prenatal care is health care during pregnancy. It helps you and your unborn baby (fetus) stay as healthy as possible. Prenatal care may be provided by a midwife, a family practice doctor, a mid-level practitioner (nurse practitioner or physician assistant), or a childbirth and pregnancy doctor (obstetrician). ?How does this affect me? ?During pregnancy, you will be closely monitored for any new conditions that might develop. To lower your risk of pregnancy complications, you and your health care provider will talk about any underlying conditions you have. ?How does this affect my baby? ?Early and consistent prenatal care increases the chance that your baby will be healthy during pregnancy. Prenatal care lowers the risk that your baby will be: ?Born early (prematurely). ?Smaller than expected at birth (small for gestational age). ?What can I expect at the first prenatal care visit? ?Your first prenatal care visit will likely be the longest. You should schedule your first prenatal care visit as soon as you know that you are pregnant. Your first visit is a good time to talk about any questions or concerns you have about pregnancy. ?Medical history ?At your visit, you and your health care provider will talk about your medical history, including: ?Any past pregnancies. ?Your family's medical history. ?Medical history of the baby's father. ?Any long-term (chronic) health conditions you have and how you manage them. ?Any surgeries or procedures you have had. ?Any current over-the-counter or prescription medicines, herbs, or supplements that you are taking. ?Other factors that could pose a risk to your baby, including: ?Exposure to harmful chemicals or radiation at work or at home. ?Any substance use, including tobacco, alcohol, and drug use. ?Your home setting and your stress levels, including: ?Exposure to abuse or violence. ?Household financial strain. ?Your daily health habits, including diet and  exercise. ?Tests and screenings ?Your health care provider will: ?Measure your weight, height, and blood pressure. ?Do a physical exam, including a pelvic and breast exam. ?Perform blood tests and urine tests to check for: ?Urinary tract infection. ?Sexually transmitted infections (STIs). ?Low iron levels in your blood (anemia). ?Blood type and certain proteins on red blood cells (Rh antibodies). ?Infections and immunity to viruses, such as hepatitis B and rubella. ?HIV (human immunodeficiency virus). ?Discuss your options for genetic screening. ?Tips about staying healthy ?Your health care provider will also give you information about how to keep yourself and your baby healthy, including: ?Nutrition and taking vitamins. ?Physical activity. ?How to manage pregnancy symptoms such as nausea and vomiting (morning sickness). ?Infections and substances that may be harmful to your baby and how to avoid them. ?Food safety. ?Dental care. ?Working. ?Travel. ?Warning signs to watch for and when to call your health care provider. ?How often will I have prenatal care visits? ?After your first prenatal care visit, you will have regular visits throughout your pregnancy. The visit schedule is often as follows: ?Up to week 28 of pregnancy: once every 4 weeks. ?28-36 weeks: once every 2 weeks. ?After 36 weeks: every week until delivery. ?Some women may have visits more or less often depending on any underlying health conditions and the health of the baby. ?Keep all follow-up and prenatal care visits. This is important. ?What happens during routine prenatal care visits? ?Your health care provider will: ?Measure your weight and blood pressure. ?Check for fetal heart sounds. ?Measure the height of your uterus in your abdomen (fundal height). This may be measured starting around week 20 of pregnancy. ?Check the position of your baby inside your uterus. ?Ask questions   about your diet, sleeping patterns, and whether you can feel the baby  move. ?Review warning signs to watch for and signs of labor. ?Ask about any pregnancy symptoms you are having and how you are dealing with them. Symptoms may include: ?Headaches. ?Nausea and vomiting. ?Vaginal discharge. ?Swelling. ?Fatigue. ?Constipation. ?Changes in your vision. ?Feeling persistently sad or anxious. ?Any discomfort, including back or pelvic pain. ?Bleeding or spotting. ?Make a list of questions to ask your health care provider at your routine visits. ?What tests might I have during prenatal care visits? ?You may have blood, urine, and imaging tests throughout your pregnancy, such as: ?Urine tests to check for glucose, protein, or signs of infection. ?Glucose tests to check for a form of diabetes that can develop during pregnancy (gestational diabetes mellitus). This is usually done around week 24 of pregnancy. ?Ultrasounds to check your baby's growth and development, to check for birth defects, and to check your baby's well-being. These can also help to decide when you should deliver your baby. ?A test to check for group B strep (GBS) infection. This is usually done around week 36 of pregnancy. ?Genetic testing. This may include blood, fluid, or tissue sampling, or imaging tests, such as an ultrasound. Some genetic tests are done during the first trimester and some are done during the second trimester. ?What else can I expect during prenatal care visits? ?Your health care provider may recommend getting certain vaccines during pregnancy. These may include: ?A yearly flu shot (annual influenza vaccine). This is especially important if you will be pregnant during flu season. ?Tdap (tetanus, diphtheria, pertussis) vaccine. Getting this vaccine during pregnancy can protect your baby from whooping cough (pertussis) after birth. This vaccine may be recommended between weeks 27 and 36 of pregnancy. ?A COVID-19 vaccine. ?Later in your pregnancy, your health care provider may give you information  about: ?Childbirth and breastfeeding classes. ?Choosing a health care provider for your baby. ?Umbilical cord banking. ?Breastfeeding. ?Birth control after your baby is born. ?The hospital labor and delivery unit and how to set up a tour. ?Registering at the hospital before you go into labor. ?Where to find more information ?Office on Women's Health: womenshealth.gov ?American Pregnancy Association: americanpregnancy.org ?March of Dimes: marchofdimes.org ?Summary ?Prenatal care helps you and your baby stay as healthy as possible during pregnancy. ?Your first prenatal care visit will most likely be the longest. ?You will have visits and tests throughout your pregnancy to monitor your health and your baby's health. ?Bring a list of questions to your visits to ask your health care provider. ?Make sure to keep all follow-up and prenatal care visits. ?This information is not intended to replace advice given to you by your health care provider. Make sure you discuss any questions you have with your health care provider. ?Document Revised: 01/30/2020 Document Reviewed: 01/30/2020 ?Elsevier Patient Education ? 2022 Elsevier Inc. ? ?

## 2021-06-01 NOTE — Progress Notes (Signed)
Subjective:    Alicia Stephens is a 31 y.o. female who presents for evaluation of amenorrhea. She believes she could be pregnant. Pregnancy is desired. Sexual Activity: single partner, contraception: none. Current symptoms also include: fatigue and nausea. Last period was normal.   Patient's last menstrual period was 04/12/2021 (exact date). The following portions of the patient's history were reviewed and updated as appropriate: allergies, current medications, past family history, past medical history, past social history, past surgical history, and problem list.  Review of Systems Pertinent items are noted in HPI.     Objective:    LMP 04/12/2021 (Exact Date)  General: alert, cooperative, appears stated age, and no acute distress    Lab Review Urine HCG: positive    Assessment:    Absence of menstruation.     Plan:    Pregnancy Test:  Positive: EDC: 01/17/2022. Briefly discussed pre-natal care options. MD and midwifery care reviewed.  Encouraged well-balanced diet, plenty of rest when needed, pre-natal vitamins daily and walking for exercise. Discussed self-help for nausea, avoiding OTC medications until consulting provider or pharmacist, other than Tylenol as needed, minimal caffeine (1-2 cups daily) and avoiding alcohol. She will schedule u/s for dating /viability 1-2 wks , nurse visit @ [redacted] wks pregnant and  her initial OB visit [redacted] wks pregnant. Feel free to call with any questions. Orders placed for diclegis .   Philip Aspen, CNM

## 2021-06-11 ENCOUNTER — Ambulatory Visit (INDEPENDENT_AMBULATORY_CARE_PROVIDER_SITE_OTHER): Payer: Medicaid Other

## 2021-06-11 ENCOUNTER — Other Ambulatory Visit: Payer: Self-pay

## 2021-06-11 DIAGNOSIS — Z3200 Encounter for pregnancy test, result unknown: Secondary | ICD-10-CM | POA: Diagnosis not present

## 2021-06-22 NOTE — Progress Notes (Signed)
Alicia Stephens presents for NOB nurse intake visit. Pregnancy confirmation done at [facility] 06/01/2021 with Doreene Burke, CNM.  G. 2 P. 1001 LMP. 04/12/21 EDD.01/27/21 (by [place])  [place]. [redacted]w[redacted]d Pregnancy education material explained and given.  1 cat in the home, no litterbox.  NOB labs ordered. BMI is not greater than 30. TSH/HbgA1c not ordered. Sickle cell not ordered due to race. HIV and drug screen explained and ordered/declined. Genetic screening discussed. Genetic testing; next visit. Pt to discuss genetic testing with provider. PNV encouraged. Pt to follow up with provider in 3 weeks for NOB physical.  FMLA,[facility] Financial Policy and HIV/Drug all reviewed and signed by patient.   Lab tech was not in office, patient will schedule lab appt for next week.

## 2021-06-25 ENCOUNTER — Other Ambulatory Visit: Payer: Self-pay

## 2021-06-25 ENCOUNTER — Ambulatory Visit (INDEPENDENT_AMBULATORY_CARE_PROVIDER_SITE_OTHER): Payer: Medicaid Other | Admitting: Certified Nurse Midwife

## 2021-06-25 ENCOUNTER — Telehealth: Payer: Self-pay

## 2021-06-25 VITALS — BP 95/65 | HR 80 | Ht 70.0 in | Wt 176.2 lb

## 2021-06-25 DIAGNOSIS — T7589XA Other specified effects of external causes, initial encounter: Secondary | ICD-10-CM | POA: Diagnosis not present

## 2021-06-25 DIAGNOSIS — Z3481 Encounter for supervision of other normal pregnancy, first trimester: Secondary | ICD-10-CM

## 2021-06-25 DIAGNOSIS — Z113 Encounter for screening for infections with a predominantly sexual mode of transmission: Secondary | ICD-10-CM | POA: Diagnosis not present

## 2021-06-25 DIAGNOSIS — Z3A10 10 weeks gestation of pregnancy: Secondary | ICD-10-CM

## 2021-06-25 NOTE — Telephone Encounter (Signed)
Updated EDD based off of [place]

## 2021-06-29 ENCOUNTER — Other Ambulatory Visit: Payer: Medicaid Other

## 2021-06-29 ENCOUNTER — Other Ambulatory Visit: Payer: Self-pay

## 2021-06-29 DIAGNOSIS — Z113 Encounter for screening for infections with a predominantly sexual mode of transmission: Secondary | ICD-10-CM | POA: Diagnosis not present

## 2021-06-29 DIAGNOSIS — Z3A10 10 weeks gestation of pregnancy: Secondary | ICD-10-CM | POA: Diagnosis not present

## 2021-06-29 DIAGNOSIS — Z3481 Encounter for supervision of other normal pregnancy, first trimester: Secondary | ICD-10-CM | POA: Diagnosis not present

## 2021-06-30 LAB — VIRAL HEPATITIS HBV, HCV
HCV Ab: NONREACTIVE
Hep B Core Total Ab: NEGATIVE
Hep B Surface Ab, Qual: REACTIVE
Hepatitis B Surface Ag: NEGATIVE

## 2021-06-30 LAB — URINALYSIS, ROUTINE W REFLEX MICROSCOPIC
Bilirubin, UA: NEGATIVE
Glucose, UA: NEGATIVE
Ketones, UA: NEGATIVE
Leukocytes,UA: NEGATIVE
Nitrite, UA: NEGATIVE
Protein,UA: NEGATIVE
RBC, UA: NEGATIVE
Specific Gravity, UA: 1.01 (ref 1.005–1.030)
Urobilinogen, Ur: 0.2 mg/dL (ref 0.2–1.0)
pH, UA: 7 (ref 5.0–7.5)

## 2021-06-30 LAB — ANTIBODY SCREEN: Antibody Screen: NEGATIVE

## 2021-06-30 LAB — HCV INTERPRETATION

## 2021-06-30 LAB — RPR: RPR Ser Ql: NONREACTIVE

## 2021-06-30 LAB — ABO AND RH: Rh Factor: POSITIVE

## 2021-06-30 LAB — VARICELLA ZOSTER ANTIBODY, IGG: Varicella zoster IgG: >4000 index (ref >165)

## 2021-06-30 LAB — RUBELLA SCREEN: Rubella Antibodies, IGG: 1.59 index (ref >0.99)

## 2021-07-01 ENCOUNTER — Encounter: Payer: Self-pay | Admitting: Certified Nurse Midwife

## 2021-07-01 ENCOUNTER — Other Ambulatory Visit: Payer: Self-pay

## 2021-07-01 LAB — CULTURE, OB URINE

## 2021-07-01 LAB — URINE CULTURE, OB REFLEX

## 2021-07-01 MED ORDER — VITAFOL FE+ 90-0.6-0.4-200 MG PO CAPS: 1.0000 | ORAL_CAPSULE | Freq: Every day | ORAL | 9 refills | Status: DC

## 2021-07-02 LAB — GC/CHLAMYDIA PROBE AMP
Chlamydia trachomatis, NAA: NEGATIVE
Neisseria Gonorrhoeae by PCR: NEGATIVE

## 2021-07-13 LAB — PAIN MGT SCRN (14 DRUGS), UR

## 2021-07-13 LAB — NICOTINE SCREEN, URINE

## 2021-07-14 ENCOUNTER — Other Ambulatory Visit (HOSPITAL_COMMUNITY)
Admission: RE | Admit: 2021-07-14 | Discharge: 2021-07-14 | Disposition: A | Discharge status: Home / Self Care | Payer: Medicaid Other | Source: Ambulatory Visit | Attending: Certified Nurse Midwife | Admitting: Certified Nurse Midwife

## 2021-07-14 ENCOUNTER — Ambulatory Visit (INDEPENDENT_AMBULATORY_CARE_PROVIDER_SITE_OTHER): Payer: Medicaid Other | Admitting: Certified Nurse Midwife

## 2021-07-14 ENCOUNTER — Other Ambulatory Visit: Payer: Self-pay

## 2021-07-14 ENCOUNTER — Encounter: Payer: Self-pay | Admitting: Certified Nurse Midwife

## 2021-07-14 VITALS — BP 100/67 | HR 87 | Wt 179.9 lb

## 2021-07-14 DIAGNOSIS — Z1379 Encounter for other screening for genetic and chromosomal anomalies: Secondary | ICD-10-CM

## 2021-07-14 DIAGNOSIS — Z3482 Encounter for supervision of other normal pregnancy, second trimester: Secondary | ICD-10-CM

## 2021-07-14 DIAGNOSIS — Z124 Encounter for screening for malignant neoplasm of cervix: Secondary | ICD-10-CM

## 2021-07-14 DIAGNOSIS — Z3A14 14 weeks gestation of pregnancy: Secondary | ICD-10-CM

## 2021-07-14 DIAGNOSIS — Z3480 Encounter for supervision of other normal pregnancy, unspecified trimester: Secondary | ICD-10-CM | POA: Diagnosis not present

## 2021-07-14 LAB — POCT URINALYSIS DIPSTICK OB
Bilirubin, UA: NEGATIVE
Blood, UA: NEGATIVE
Glucose, UA: NEGATIVE
Ketones, UA: NEGATIVE
Leukocytes, UA: NEGATIVE
Nitrite, UA: NEGATIVE
POC,PROTEIN,UA: NEGATIVE
Spec Grav, UA: 1.02 (ref 1.010–1.025)
Urobilinogen, UA: 0.2 E.U./dL
pH, UA: 6.5 (ref 5.0–8.0)

## 2021-07-14 NOTE — Patient Instructions (Signed)
Prenatal Care ?Prenatal care is health care during pregnancy. It helps you and your unborn baby (fetus) stay as healthy as possible. Prenatal care may be provided by a midwife, a family practice doctor, a mid-level practitioner (nurse practitioner or physician assistant), or a childbirth and pregnancy doctor (obstetrician). ?How does this affect me? ?During pregnancy, you will be closely monitored for any new conditions that might develop. To lower your risk of pregnancy complications, you and your health care provider will talk about any underlying conditions you have. ?How does this affect my baby? ?Early and consistent prenatal care increases the chance that your baby will be healthy during pregnancy. Prenatal care lowers the risk that your baby will be: ?Born early (prematurely). ?Smaller than expected at birth (small for gestational age). ?What can I expect at the first prenatal care visit? ?Your first prenatal care visit will likely be the longest. You should schedule your first prenatal care visit as soon as you know that you are pregnant. Your first visit is a good time to talk about any questions or concerns you have about pregnancy. ?Medical history ?At your visit, you and your health care provider will talk about your medical history, including: ?Any past pregnancies. ?Your family's medical history. ?Medical history of the baby's father. ?Any long-term (chronic) health conditions you have and how you manage them. ?Any surgeries or procedures you have had. ?Any current over-the-counter or prescription medicines, herbs, or supplements that you are taking. ?Other factors that could pose a risk to your baby, including: ?Exposure to harmful chemicals or radiation at work or at home. ?Any substance use, including tobacco, alcohol, and drug use. ?Your home setting and your stress levels, including: ?Exposure to abuse or violence. ?Household financial strain. ?Your daily health habits, including diet and  exercise. ?Tests and screenings ?Your health care provider will: ?Measure your weight, height, and blood pressure. ?Do a physical exam, including a pelvic and breast exam. ?Perform blood tests and urine tests to check for: ?Urinary tract infection. ?Sexually transmitted infections (STIs). ?Low iron levels in your blood (anemia). ?Blood type and certain proteins on red blood cells (Rh antibodies). ?Infections and immunity to viruses, such as hepatitis B and rubella. ?HIV (human immunodeficiency virus). ?Discuss your options for genetic screening. ?Tips about staying healthy ?Your health care provider will also give you information about how to keep yourself and your baby healthy, including: ?Nutrition and taking vitamins. ?Physical activity. ?How to manage pregnancy symptoms such as nausea and vomiting (morning sickness). ?Infections and substances that may be harmful to your baby and how to avoid them. ?Food safety. ?Dental care. ?Working. ?Travel. ?Warning signs to watch for and when to call your health care provider. ?How often will I have prenatal care visits? ?After your first prenatal care visit, you will have regular visits throughout your pregnancy. The visit schedule is often as follows: ?Up to week 28 of pregnancy: once every 4 weeks. ?28-36 weeks: once every 2 weeks. ?After 36 weeks: every week until delivery. ?Some women may have visits more or less often depending on any underlying health conditions and the health of the baby. ?Keep all follow-up and prenatal care visits. This is important. ?What happens during routine prenatal care visits? ?Your health care provider will: ?Measure your weight and blood pressure. ?Check for fetal heart sounds. ?Measure the height of your uterus in your abdomen (fundal height). This may be measured starting around week 20 of pregnancy. ?Check the position of your baby inside your uterus. ?Ask questions   about your diet, sleeping patterns, and whether you can feel the baby  move. ?Review warning signs to watch for and signs of labor. ?Ask about any pregnancy symptoms you are having and how you are dealing with them. Symptoms may include: ?Headaches. ?Nausea and vomiting. ?Vaginal discharge. ?Swelling. ?Fatigue. ?Constipation. ?Changes in your vision. ?Feeling persistently sad or anxious. ?Any discomfort, including back or pelvic pain. ?Bleeding or spotting. ?Make a list of questions to ask your health care provider at your routine visits. ?What tests might I have during prenatal care visits? ?You may have blood, urine, and imaging tests throughout your pregnancy, such as: ?Urine tests to check for glucose, protein, or signs of infection. ?Glucose tests to check for a form of diabetes that can develop during pregnancy (gestational diabetes mellitus). This is usually done around week 24 of pregnancy. ?Ultrasounds to check your baby's growth and development, to check for birth defects, and to check your baby's well-being. These can also help to decide when you should deliver your baby. ?A test to check for group B strep (GBS) infection. This is usually done around week 36 of pregnancy. ?Genetic testing. This may include blood, fluid, or tissue sampling, or imaging tests, such as an ultrasound. Some genetic tests are done during the first trimester and some are done during the second trimester. ?What else can I expect during prenatal care visits? ?Your health care provider may recommend getting certain vaccines during pregnancy. These may include: ?A yearly flu shot (annual influenza vaccine). This is especially important if you will be pregnant during flu season. ?Tdap (tetanus, diphtheria, pertussis) vaccine. Getting this vaccine during pregnancy can protect your baby from whooping cough (pertussis) after birth. This vaccine may be recommended between weeks 27 and 36 of pregnancy. ?A COVID-19 vaccine. ?Later in your pregnancy, your health care provider may give you information  about: ?Childbirth and breastfeeding classes. ?Choosing a health care provider for your baby. ?Umbilical cord banking. ?Breastfeeding. ?Birth control after your baby is born. ?The hospital labor and delivery unit and how to set up a tour. ?Registering at the hospital before you go into labor. ?Where to find more information ?Office on Women's Health: womenshealth.gov ?American Pregnancy Association: americanpregnancy.org ?March of Dimes: marchofdimes.org ?Summary ?Prenatal care helps you and your baby stay as healthy as possible during pregnancy. ?Your first prenatal care visit will most likely be the longest. ?You will have visits and tests throughout your pregnancy to monitor your health and your baby's health. ?Bring a list of questions to your visits to ask your health care provider. ?Make sure to keep all follow-up and prenatal care visits. ?This information is not intended to replace advice given to you by your health care provider. Make sure you discuss any questions you have with your health care provider. ?Document Revised: 01/30/2020 Document Reviewed: 01/30/2020 ?Elsevier Patient Education ? 2022 Elsevier Inc. ? ?

## 2021-07-14 NOTE — Progress Notes (Signed)
NEW OB HISTORY AND PHYSICAL ? ?SUBJECTIVE:  ?    ? Alicia Stephens is a 31 y.o. G19P1001 female, Patient's last menstrual period was 04/12/2021 (exact date)., Estimated Date of Delivery: 01/07/22, [redacted]w[redacted]d, presents today for establishment of Prenatal Care. ?She has no unusual complaints  ? ? Married ?Living with spouse and child Ophelia ?Work: stay at home mom ?Exercise Yoga, walking, stretching ?Denies smoking, vaping, drugs and alcohol use.  ? ?Gynecologic History ?Patient's last menstrual period was 04/12/2021 (exact date). Normal ?Contraception: none ?Last Pap: 02/28/2019. Results were: normal ? ?Obstetric History ?OB History  ?Gravida Para Term Preterm AB Living  ?2 1 1     1   ?SAB IAB Ectopic Multiple Live Births  ?      0 1  ?  ?# Outcome Date GA Lbr Len/2nd Weight Sex Delivery Anes PTL Lv  ?2 Current           ?1 Term 08/27/19 [redacted]w[redacted]d  8 lb 9.9 oz (3.91 kg) F Vag-Spont EPI  LIV  ? ? ?Past Medical History:  ?Diagnosis Date  ? Breast mass   ? ? ?No past surgical history on file. ? ?Current Outpatient Medications on File Prior to Visit  ?Medication Sig Dispense Refill  ? ASHWAGANDHA PO Take by mouth.    ? Prenat-Fe Poly-Methfol-FA-DHA (VITAFOL FE+) 90-0.6-0.4-200 MG CAPS Take 1 tablet by mouth daily. 30 capsule 9  ? Doxylamine-Pyridoxine 10-10 MG TBEC Take 1 tablet by mouth 4 (four) times daily. Day 1 &2: 2 tablet at bedtimeDay 3 : if symptoms persists 1 tablet am; 2 tablet at bedtimeDay 4: 1 tablet am, 1 tab afternoon, 2 tab at bedtime (Patient not taking: Reported on 06/25/2021) 120 tablet 5  ? ?No current facility-administered medications on file prior to visit.  ? ? ?No Known Allergies ? ?Social History  ? ?Socioeconomic History  ? Marital status: Single  ?  Spouse name: Not on file  ? Number of children: Not on file  ? Years of education: Not on file  ? Highest education level: Not on file  ?Occupational History  ? Not on file  ?Tobacco Use  ? Smoking status: Never  ? Smokeless tobacco: Never  ?Vaping Use  ?  Vaping Use: Never used  ?Substance and Sexual Activity  ? Alcohol use: Not Currently  ? Drug use: Not Currently  ?  Types: Marijuana  ? Sexual activity: Yes  ?  Birth control/protection: None  ?Other Topics Concern  ? Not on file  ?Social History Narrative  ? Not on file  ? ?Social Determinants of Health  ? ?Financial Resource Strain: Not on file  ?Food Insecurity: Not on file  ?Transportation Needs: Not on file  ?Physical Activity: Not on file  ?Stress: Not on file  ?Social Connections: Not on file  ?Intimate Partner Violence: Not on file  ? ? ?Family History  ?Problem Relation Age of Onset  ? Breast cancer Paternal Grandmother   ? Diabetes Other   ? ? ?The following portions of the patient's history were reviewed and updated as appropriate: allergies, current medications, past OB history, past medical history, past surgical history, past family history, past social history, and problem list. ? ? ? ?OBJECTIVE: ?Initial Physical Exam (New OB) ? ?GENERAL APPEARANCE: alert, well appearing, in no apparent distress, oriented to person, place and time ?HEAD: normocephalic, atraumatic ?MOUTH: mucous membranes moist, pharynx normal without lesions ?THYROID: no thyromegaly or masses present ?BREASTS: no masses noted, no significant tenderness, no palpable axillary nodes,  no skin changes ?LUNGS: clear to auscultation, no wheezes, rales or rhonchi, symmetric air entry ?HEART: regular rate and rhythm, no murmurs ?ABDOMEN: soft, nontender, nondistended, no abnormal masses, no epigastric pain ?EXTREMITIES: no redness or tenderness in the calves or thighs ?SKIN: normal coloration and turgor, no rashes ?LYMPH NODES: no adenopathy palpable ?NEUROLOGIC: alert, oriented, normal speech, no focal findings or movement disorder noted ? ?PELVIC EXAM EXTERNAL GENITALIA: normal appearing vulva with no masses, tenderness or lesions ?VAGINA: no abnormal discharge or lesions ?CERVIX: no lesions or cervical motion tenderness, pap collected.   ?UTERUS: gravid ?ADNEXA: no masses palpable and nontender ?OB EXAM PELVIMETRY: appears adequate ?RECTUM: exam not indicated ? ?ASSESSMENT: ?Normal pregnancy ? ?PLAN: ?Prenatal care ?See orders New OB counseling: ?The patient has been given an overview regarding routine prenatal care. Recommendations regarding diet, weight gain, and exercise in pregnancy were given. Prenatal testing, optional genetic testing, carrier screening , and ultrasound use in pregnancy were reviewed. Benefits of Breast Feeding were discussed. The patient is encouraged to consider nursing her baby post partum.  ? ?Philip Aspen, CNM  ?

## 2021-07-15 LAB — CBC
Hematocrit: 35.9 % (ref 34.0–46.6)
Hemoglobin: 12.1 g/dL (ref 11.1–15.9)
MCH: 30.3 pg (ref 26.6–33.0)
MCHC: 33.7 g/dL (ref 31.5–35.7)
MCV: 90 fL (ref 79–97)
Platelets: 279 10*3/uL (ref 150–450)
RBC: 4 x10E6/uL (ref 3.77–5.28)
RDW: 12.3 % (ref 11.7–15.4)
WBC: 8.7 10*3/uL (ref 3.4–10.8)

## 2021-07-20 ENCOUNTER — Encounter: Payer: Self-pay | Admitting: Certified Nurse Midwife

## 2021-07-20 LAB — CYTOLOGY - PAP
Comment: NEGATIVE
Comment: NEGATIVE
Diagnosis: UNDETERMINED — AB
HPV 16: POSITIVE — AB
HPV 18 / 45: NEGATIVE
High risk HPV: POSITIVE — AB

## 2021-07-28 ENCOUNTER — Ambulatory Visit (INDEPENDENT_AMBULATORY_CARE_PROVIDER_SITE_OTHER): Payer: Medicaid Other | Admitting: Obstetrics

## 2021-07-28 VITALS — BP 118/75 | HR 85 | Wt 179.0 lb

## 2021-07-28 DIAGNOSIS — Z3482 Encounter for supervision of other normal pregnancy, second trimester: Secondary | ICD-10-CM

## 2021-07-28 DIAGNOSIS — Z3A16 16 weeks gestation of pregnancy: Secondary | ICD-10-CM

## 2021-07-28 LAB — POCT URINALYSIS DIPSTICK OB
Bilirubin, UA: NEGATIVE
Blood, UA: NEGATIVE
Glucose, UA: NEGATIVE
Ketones, UA: NEGATIVE
Leukocytes, UA: NEGATIVE
Nitrite, UA: NEGATIVE
POC,PROTEIN,UA: NEGATIVE
Spec Grav, UA: 1.01 (ref 1.010–1.025)
Urobilinogen, UA: 0.2 E.U./dL
pH, UA: 5 (ref 5.0–8.0)

## 2021-07-28 NOTE — Progress Notes (Signed)
ROB at [redacted]w[redacted]d. Feeling some flutters. Questions answered about colposcopy scheduled for 08/12/20. Feeling some pressure and round ligament pain. Reviewed comfort measures and danger signs. Would like anatomy US. RTC in 3-4 weeks for ROB and [place]. ? ?Lloyd Huger, CNM ?

## 2021-08-11 NOTE — Progress Notes (Signed)
? ? ?  GYNECOLOGY OFFICE COLPOSCOPY PROCEDURE NOTE ? ?31 y.o. G2P1001 here for colposcopy for ASCUS with POSITIVE high risk HPV pap smear on 07/14/2021. Discussed role for HPV in cervical dysplasia, need for surveillance.  Patient is currently pregnant,  ? ?Patient gave informed written consent, time out was performed.  Placed in lithotomy position. Cervix viewed with speculum and colposcope after application of acetic acid.  ? ?Colposcopy adequate? Yes ? HPV changes noted at 2 o'clock; no biopsies obtained.  ECC not performed.  ? ?Chaperone was present during entire procedure. ? ?Patient was given post procedure instructions.  Will follow up pathology postpartum with repeat colposcopy and biopsy if needed at that time.  Continue routine prenatal care.  ? ? ?Hildred Laser, MD ?[facility] ? ? ?

## 2021-08-12 ENCOUNTER — Encounter: Payer: Self-pay | Admitting: Obstetrics and Gynecology

## 2021-08-12 ENCOUNTER — Ambulatory Visit (INDEPENDENT_AMBULATORY_CARE_PROVIDER_SITE_OTHER): Payer: Medicaid Other | Admitting: Obstetrics and Gynecology

## 2021-08-12 VITALS — BP 114/53 | HR 79 | Resp 16 | Ht 69.0 in | Wt 181.1 lb

## 2021-08-12 DIAGNOSIS — R87610 Atypical squamous cells of undetermined significance on cytologic smear of cervix (ASC-US): Secondary | ICD-10-CM | POA: Diagnosis not present

## 2021-08-12 DIAGNOSIS — R87810 Cervical high risk human papillomavirus (HPV) DNA test positive: Secondary | ICD-10-CM | POA: Diagnosis not present

## 2021-08-12 DIAGNOSIS — Z3A18 18 weeks gestation of pregnancy: Secondary | ICD-10-CM

## 2021-08-12 NOTE — Patient Instructions (Signed)
Colposcopy, Care After The following information offers guidance on how to care for yourself after your procedure. Your doctor may also give you more specific instructions. If you have problems or questions, contact your doctor. What can I expect after the procedure? If you did not have a sample of your tissue taken out (did not have a biopsy), you may only have some spotting of blood for a few days. You can go back to your normal activities. If you had a sample of your tissue taken out, it is common to have: Soreness and mild pain. These may last for a few days. Mild bleeding or fluid (discharge) coming from your vagina. The fluid will look dark and grainy. You may have this for a few days. The fluid may be caused by a liquid that was used during your procedure. You may need to wear a sanitary pad. Spotting of blood for at least 48 hours after the procedure. Follow these instructions at home: Medicines Take over-the-counter and prescription medicines only as told by your doctor. Ask your doctor what over-the-counter pain medicines and prescription medicines you can start taking again. This is very important if you take blood thinners. Activity For at least 3 days, or for as long as told by your doctor, avoid: Douching. Using tampons. Having sex. Return to your normal activities as told by your doctor. Ask your doctor what activities are safe for you. General instructions Ask your doctor if you may take baths, swim, or use a hot tub. You may take showers. If you use birth control (contraception), keep using it. Keep all follow-up visits. Contact a doctor if: You have a fever or chills. You faint or feel light-headed. Get help right away if: You bleed a lot from your vagina. A lot of bleeding means that the bleeding soaks through a pad in less than 1 hour. You have clumps of blood (blood clots) coming from your vagina. You have signs that could mean you have an infection. This may be fluid  coming from your vagina that is: Different than normal. Yellow. Bad-smelling. You have very bad pain or cramps in your lower belly that do not get better with medicine. Summary If you did not have a sample of your tissue taken out, you may only have some spotting of blood for a few days. You can go back to your normal activities. If you had a sample of your tissue taken out, it is common to have mild pain for a few days and spotting for 48 hours. Avoid douching, using tampons, and having sex for at least 3 days after the procedure or for as long as told. Get help right away if you have a lot of bleeding, very bad pain, or signs of infection. This information is not intended to replace advice given to you by your health care provider. Make sure you discuss any questions you have with your health care provider. Document Revised: 09/13/2020 Document Reviewed: 09/13/2020 Elsevier Patient Education  2022 Elsevier Inc.  

## 2021-08-31 ENCOUNTER — Ambulatory Visit (INDEPENDENT_AMBULATORY_CARE_PROVIDER_SITE_OTHER): Payer: Medicaid Other | Admitting: Certified Nurse Midwife

## 2021-08-31 ENCOUNTER — Ambulatory Visit (INDEPENDENT_AMBULATORY_CARE_PROVIDER_SITE_OTHER): Payer: Medicaid Other

## 2021-08-31 ENCOUNTER — Encounter: Payer: Self-pay | Admitting: Certified Nurse Midwife

## 2021-08-31 VITALS — BP 103/66 | HR 73 | Wt 185.3 lb

## 2021-08-31 DIAGNOSIS — Z3482 Encounter for supervision of other normal pregnancy, second trimester: Secondary | ICD-10-CM

## 2021-08-31 DIAGNOSIS — Z3A21 21 weeks gestation of pregnancy: Secondary | ICD-10-CM

## 2021-08-31 LAB — POCT URINALYSIS DIPSTICK OB
Bilirubin, UA: NEGATIVE
Blood, UA: NEGATIVE
Glucose, UA: NEGATIVE
Ketones, UA: NEGATIVE
Leukocytes, UA: NEGATIVE
Nitrite, UA: NEGATIVE
POC,PROTEIN,UA: NEGATIVE
Spec Grav, UA: 1.015 (ref 1.010–1.025)
Urobilinogen, UA: 0.2 E.U./dL
pH, UA: 6.5 (ref 5.0–8.0)

## 2021-08-31 NOTE — Progress Notes (Signed)
ROB and u/s today for anatomy. Preliminary results not available at this time to review. Discussed round ligament pain and self help measures. Will follow up with My chart message to patient once prelaminary results available.  ? ?Follow up 4 wks for ROB with Missy.  ? ?Doreene Burke, [employer]  ? ?Patient Name: Alicia Stephens ?DOB: 01-21-1991 ?MRN: [id number] ? ?ULTRASOUND REPORT ? ?Location: [facility] ?Date of Service: 08/31/2021  ? ?Indications:Anatomy Ultrasound ?Findings:  ?Singleton intrauterine pregnancy is visualized with FHR at 143 BPM.  ? ?Biometrics give an (U/S) Gestational age of [redacted]w[redacted]d and an (U/S) EDD of 01/09/22; this correlates with the clinically established Estimated Date of Delivery: 01/07/22  ? ?Fetal presentation is transverse; head maternal left. ?EFW: 412g  / 15oz. ?Placenta: posterior. Grade: 0 ?AFI: subjectively normal. ? ?Anatomic survey is complete and normal; Gender - female.   ? ?Ovaries are not visualized. ?Survey of the adnexa demonstrates no adnexal masses. ? ?There is no free peritoneal fluid in the cul de sac. ? ?Impression: ?1. [redacted]w[redacted]d Viable Singleton Intrauterine pregnancy by U/S. ?2. (U/S) EDD is consistent with Clinically established Estimated Date of Delivery: 01/07/22 . ?3. Normal Anatomy Scan ? ?Recommendations: ?1.Clinical correlation with the patient's History and Physical Exam. ? ?Sheralyn Boatman  [place] ?

## 2021-09-20 ENCOUNTER — Encounter: Payer: Self-pay | Admitting: Obstetrics

## 2021-09-30 ENCOUNTER — Encounter: Payer: Medicaid Other | Admitting: Obstetrics

## 2021-10-01 ENCOUNTER — Encounter: Payer: Medicaid Other | Admitting: Obstetrics

## 2021-10-01 DIAGNOSIS — Z3482 Encounter for supervision of other normal pregnancy, second trimester: Secondary | ICD-10-CM

## 2021-10-11 ENCOUNTER — Other Ambulatory Visit: Payer: Medicaid Other

## 2021-10-11 ENCOUNTER — Ambulatory Visit (INDEPENDENT_AMBULATORY_CARE_PROVIDER_SITE_OTHER): Payer: Medicaid Other | Admitting: Obstetrics

## 2021-10-11 VITALS — BP 105/69 | HR 102 | Wt 179.0 lb

## 2021-10-11 DIAGNOSIS — Z23 Encounter for immunization: Secondary | ICD-10-CM

## 2021-10-11 DIAGNOSIS — Z3A27 27 weeks gestation of pregnancy: Secondary | ICD-10-CM

## 2021-10-11 DIAGNOSIS — Z3482 Encounter for supervision of other normal pregnancy, second trimester: Secondary | ICD-10-CM

## 2021-10-11 MED ORDER — TETANUS-DIPHTH-ACELL PERTUSSIS 5-2.5-18.5 LF-MCG/0.5 IM SUSY: 0.5000 mL | PREFILLED_SYRINGE | Freq: Once | INTRAMUSCULAR | Status: DC

## 2021-10-11 NOTE — Progress Notes (Signed)
ROB at [redacted]w[redacted]d. Good fetal movement. Feels well overall. Denies ctx, LOF, vaginal bleeding. Did not realize 1-hour glucose was today (missed last appointment); would like to schedule lab-only visit for this. Discussed making a birth plan. She would like an epidural. Plans to BF and had a good experience nursing her first baby. Considering contraceptive options. RSB, TDaP, BTC today. RTC later this week for lab only, 2-3 weeks for ROB.  [name], CNM

## 2021-10-12 ENCOUNTER — Other Ambulatory Visit: Payer: Medicaid Other

## 2021-10-12 DIAGNOSIS — Z3A27 27 weeks gestation of pregnancy: Secondary | ICD-10-CM | POA: Diagnosis not present

## 2021-10-12 DIAGNOSIS — Z3482 Encounter for supervision of other normal pregnancy, second trimester: Secondary | ICD-10-CM | POA: Diagnosis not present

## 2021-10-13 ENCOUNTER — Encounter: Payer: Self-pay | Admitting: Obstetrics

## 2021-10-13 LAB — CBC
Hematocrit: 34.3 % (ref 34.0–46.6)
Hemoglobin: 11.8 g/dL (ref 11.1–15.9)
MCH: 30.6 pg (ref 26.6–33.0)
MCHC: 34.4 g/dL (ref 31.5–35.7)
MCV: 89 fL (ref 79–97)
Platelets: 293 10*3/uL (ref 150–450)
RBC: 3.85 x10E6/uL (ref 3.77–5.28)
RDW: 12.6 % (ref 11.7–15.4)
WBC: 12.6 10*3/uL — ABNORMAL HIGH (ref 3.4–10.8)

## 2021-10-13 LAB — GLUCOSE, 1 HOUR GESTATIONAL: Gestational Diabetes Screen: 79 mg/dL (ref 70–139)

## 2021-10-13 LAB — RPR: RPR Ser Ql: NONREACTIVE

## 2021-10-19 ENCOUNTER — Other Ambulatory Visit: Payer: Self-pay

## 2021-10-19 NOTE — Progress Notes (Unsigned)
poc

## 2021-11-01 IMAGING — CR DG CHEST 2V
2 series · 2 of 2 positions shown · non-contrast
Comparison: None.

CLINICAL DATA: Pt c/o weakness, fatigue, and shortness of breath.

EXAM:
CHEST - 2 VIEW

[chest pa]
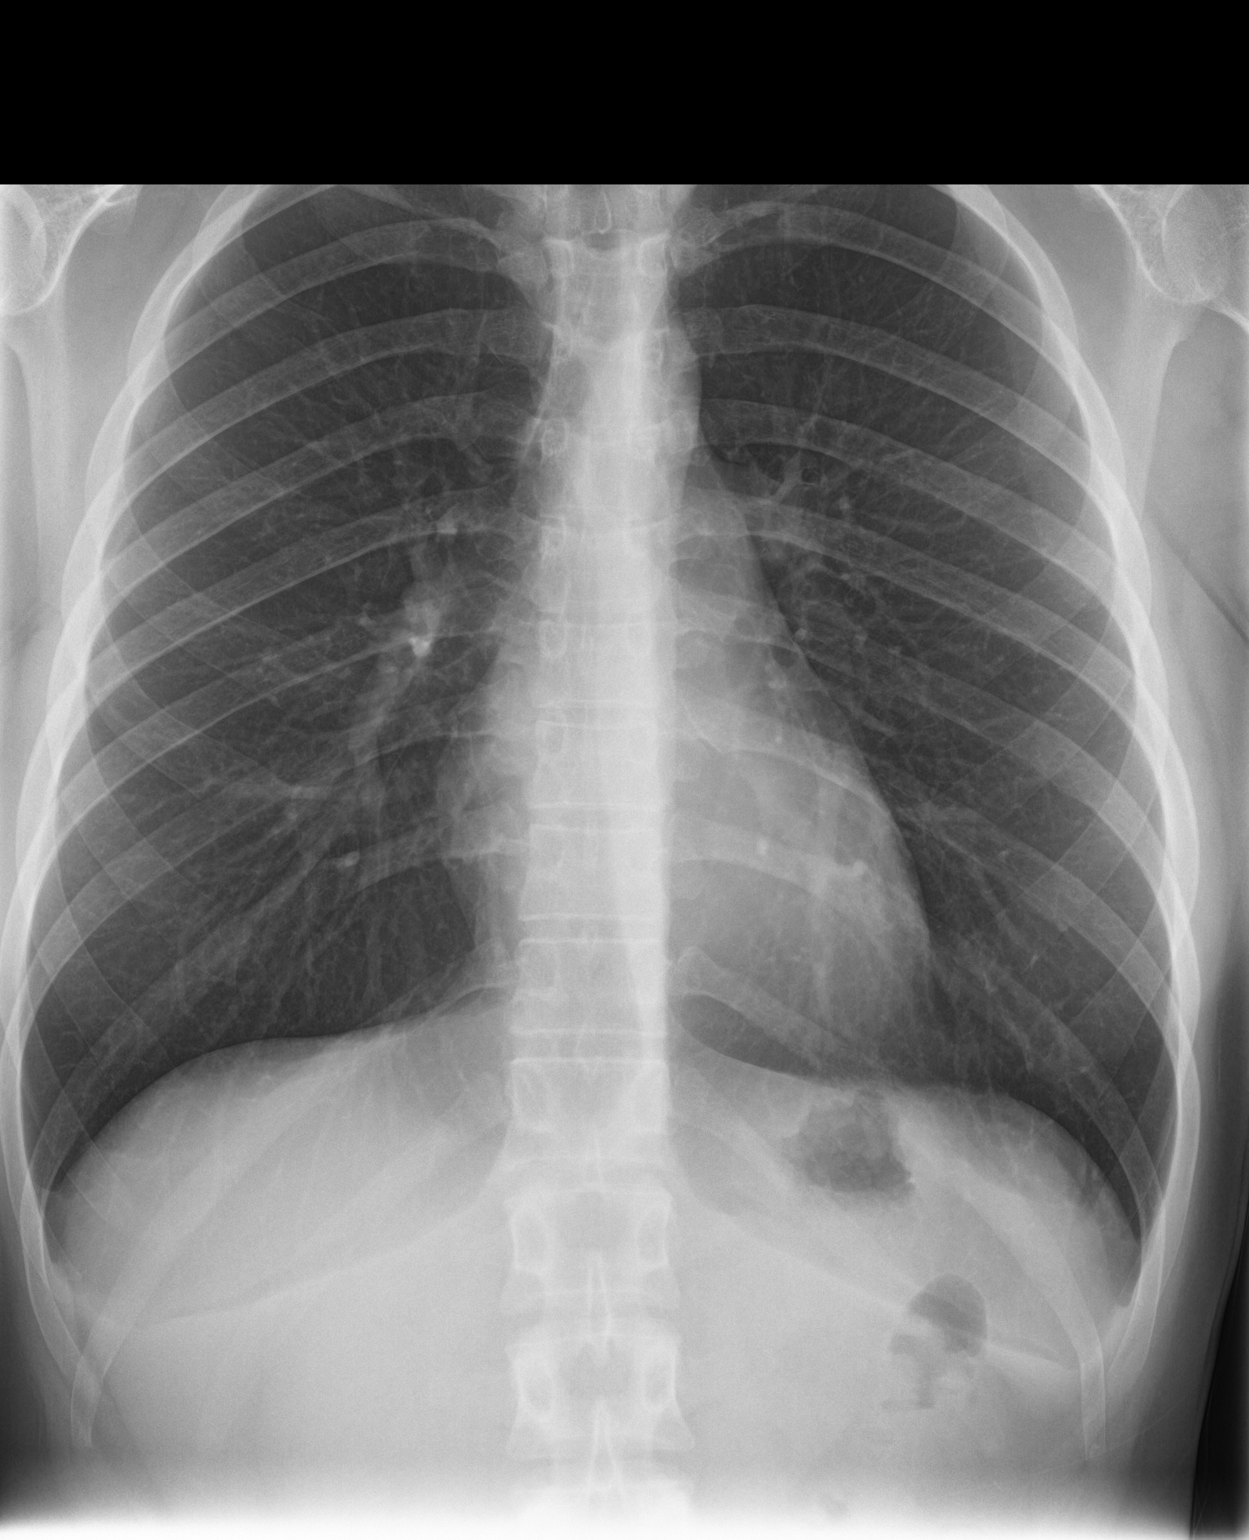

[chest lat]
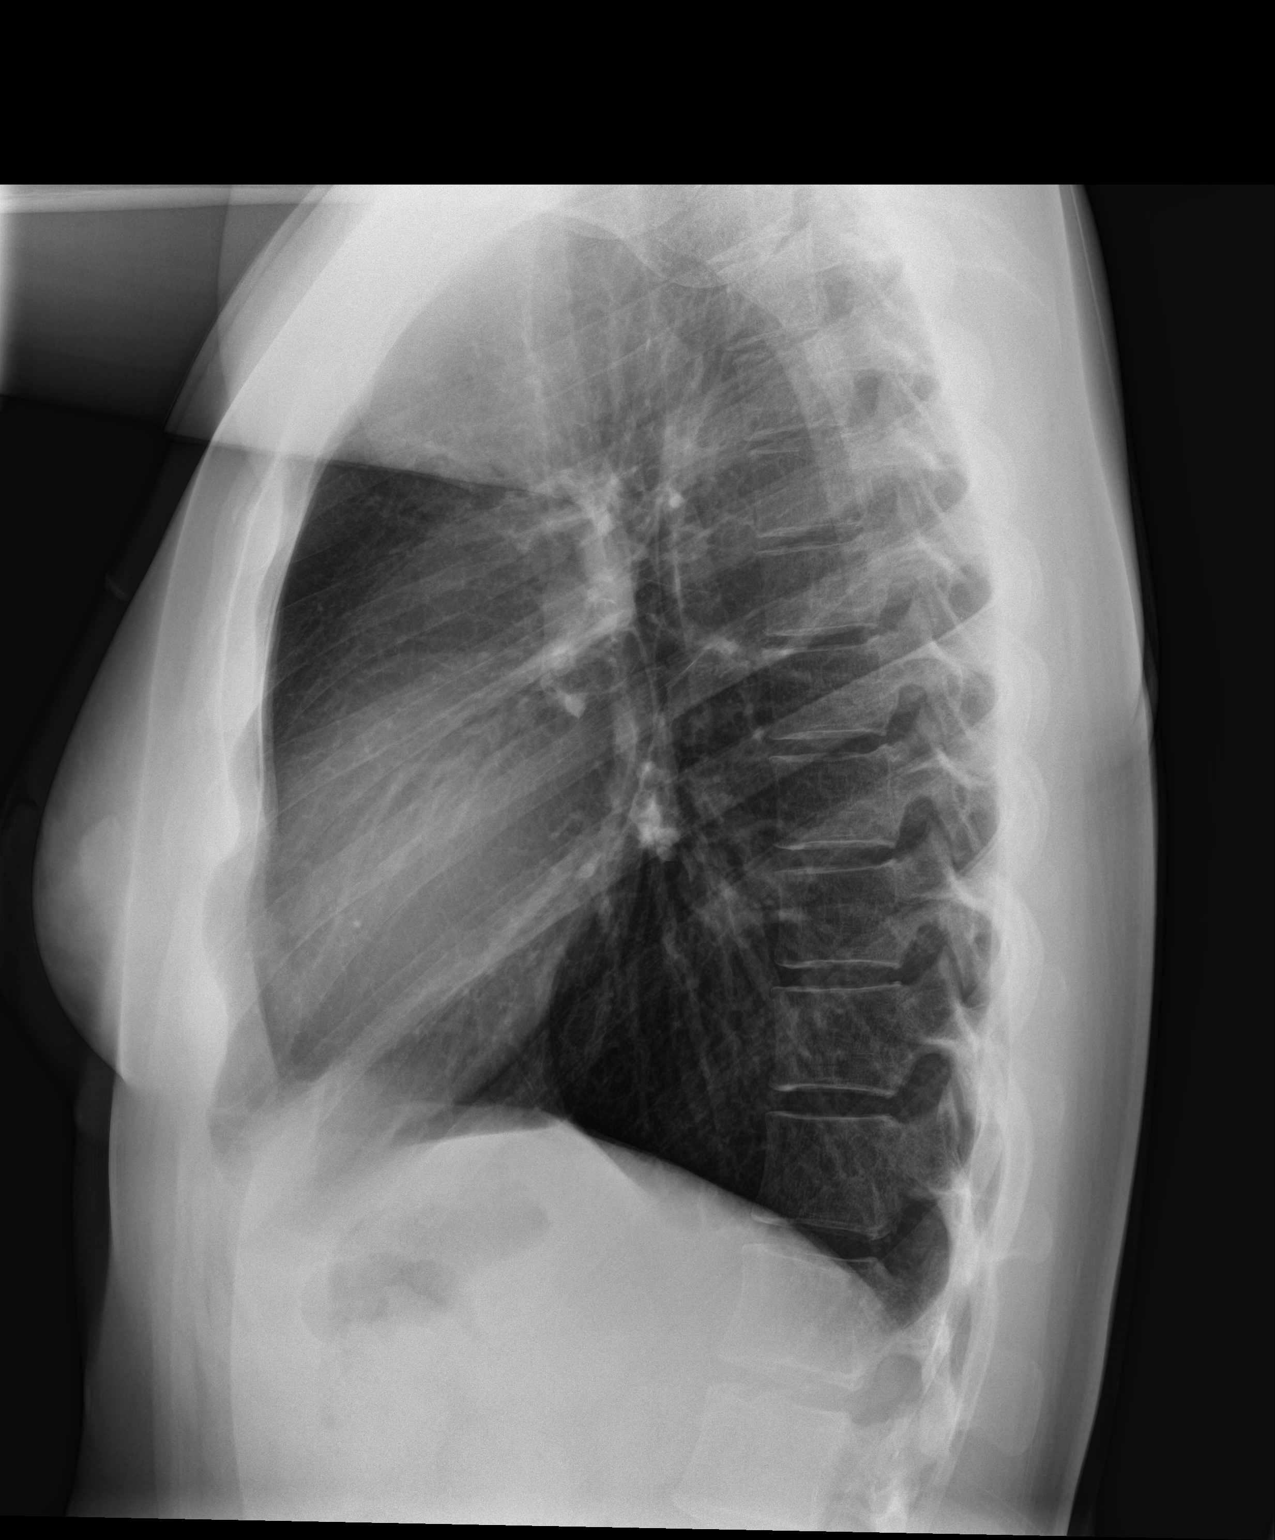

[2 of 2 positions shown; findings below may reference images not displayed]

FINDINGS: The cardiomediastinal contours are within normal limits. The lungs
are clear. No pneumothorax or pleural effusion. No acute finding in
the visualized skeleton.
IMPRESSION: No acute cardiopulmonary process.

## 2021-11-03 ENCOUNTER — Ambulatory Visit (INDEPENDENT_AMBULATORY_CARE_PROVIDER_SITE_OTHER): Payer: Medicaid Other | Admitting: Obstetrics

## 2021-11-03 ENCOUNTER — Other Ambulatory Visit: Payer: Self-pay | Admitting: Obstetrics

## 2021-11-03 VITALS — BP 93/60 | HR 88 | Wt 192.8 lb

## 2021-11-03 DIAGNOSIS — Z3A30 30 weeks gestation of pregnancy: Secondary | ICD-10-CM

## 2021-11-03 LAB — POCT URINALYSIS DIPSTICK OB
Bilirubin, UA: NEGATIVE
Blood, UA: NEGATIVE
Glucose, UA: NEGATIVE
Ketones, UA: NEGATIVE
Leukocytes, UA: NEGATIVE
Nitrite, UA: NEGATIVE
POC,PROTEIN,UA: NEGATIVE
Spec Grav, UA: 1.01 (ref 1.010–1.025)
Urobilinogen, UA: 0.2 E.U./dL
pH, UA: 8 (ref 5.0–8.0)

## 2021-11-03 MED ORDER — OMEPRAZOLE MAGNESIUM 20 MG PO TBEC: 20.0000 mg | DELAYED_RELEASE_TABLET | Freq: Every day | ORAL | 2 refills | Status: DC

## 2021-11-03 NOTE — Progress Notes (Signed)
ROB at [redacted]w[redacted]d. Active baby. Denies ctx, LOF, vaginal bleeding. Had a period of cramping for several hours that resolved with rest. Alicia Stephens reports she is having trouble with heartburn and insomnia. Discussed comfort measures, dietary changes, medication. Rx sent for omeprazole. Reviewed sleep hygiene, bedtime routine, Unisom/Benadryl PRN. Reviewed when to to go to the hospital.  RTC in 2 weeks.  [name], CNM

## 2021-11-09 ENCOUNTER — Encounter: Payer: Medicaid Other | Admitting: Certified Nurse Midwife

## 2021-11-09 DIAGNOSIS — Z3482 Encounter for supervision of other normal pregnancy, second trimester: Secondary | ICD-10-CM

## 2021-11-16 ENCOUNTER — Encounter: Payer: Self-pay | Admitting: Certified Nurse Midwife

## 2021-11-16 ENCOUNTER — Ambulatory Visit (INDEPENDENT_AMBULATORY_CARE_PROVIDER_SITE_OTHER): Payer: Medicaid Other | Admitting: Certified Nurse Midwife

## 2021-11-16 VITALS — BP 121/72 | HR 99 | Wt 194.0 lb

## 2021-11-16 DIAGNOSIS — Z3A32 32 weeks gestation of pregnancy: Secondary | ICD-10-CM

## 2021-11-16 NOTE — Progress Notes (Signed)
ROB doing well, feeling good movement. Has beach trip in Thursday, discussed hydration, and heat exhaustion. Recommend sun block and staying in shade as much as possible. She also has a wedding at the beach at the beginning of august. Reviewed PTL precautions. She verbalizes and agrees. Follow up 2 wk .   Doreene Burke, CNM

## 2021-11-28 ENCOUNTER — Other Ambulatory Visit: Payer: Self-pay | Admitting: Obstetrics

## 2021-12-01 ENCOUNTER — Ambulatory Visit (INDEPENDENT_AMBULATORY_CARE_PROVIDER_SITE_OTHER): Payer: Medicaid Other | Admitting: Certified Nurse Midwife

## 2021-12-01 VITALS — BP 121/70 | HR 91 | Wt 198.3 lb

## 2021-12-01 DIAGNOSIS — Z3A34 34 weeks gestation of pregnancy: Secondary | ICD-10-CM

## 2021-12-01 NOTE — Progress Notes (Signed)
ROB doing well, has not concerns today. States baby is moving well. Reviewed GBS testing next visit. She verbalizes and agrees to plan of care. Follow up 2 ws for ROB.   Doreene Burke, CNM

## 2021-12-01 NOTE — Patient Instructions (Signed)

## 2021-12-13 ENCOUNTER — Other Ambulatory Visit: Payer: Self-pay | Admitting: Obstetrics

## 2021-12-17 ENCOUNTER — Encounter: Payer: Self-pay | Admitting: Obstetrics

## 2021-12-17 ENCOUNTER — Ambulatory Visit (INDEPENDENT_AMBULATORY_CARE_PROVIDER_SITE_OTHER): Payer: Medicaid Other | Admitting: Obstetrics

## 2021-12-17 ENCOUNTER — Other Ambulatory Visit (HOSPITAL_COMMUNITY)
Admission: RE | Admit: 2021-12-17 | Discharge: 2021-12-17 | Disposition: A | Discharge status: Home / Self Care | Payer: Medicaid Other | Source: Ambulatory Visit | Attending: Obstetrics | Admitting: Obstetrics

## 2021-12-17 VITALS — BP 115/70 | HR 91 | Wt 199.0 lb

## 2021-12-17 DIAGNOSIS — Z3A37 37 weeks gestation of pregnancy: Secondary | ICD-10-CM

## 2021-12-17 DIAGNOSIS — Z3483 Encounter for supervision of other normal pregnancy, third trimester: Secondary | ICD-10-CM

## 2021-12-17 LAB — POCT URINALYSIS DIPSTICK OB
Bilirubin, UA: NEGATIVE
Blood, UA: NEGATIVE
Glucose, UA: NEGATIVE
Ketones, UA: NEGATIVE
Leukocytes, UA: NEGATIVE
Nitrite, UA: NEGATIVE
POC,PROTEIN,UA: NEGATIVE
Spec Grav, UA: 1.025 (ref 1.010–1.025)
Urobilinogen, UA: 0.2 E.U./dL
pH, UA: 6 (ref 5.0–8.0)

## 2021-12-17 NOTE — Progress Notes (Signed)
ROB at [redacted]w[redacted]d. Active baby. Denies ctx, LOF, and vaginal bleeding. Feeling some intermittent pressure. Jon Gills enjoyed her beach trips and is now relaxing at home. Everything is ready at home; car seat in car. Reviewed s/s of labor and when to go to the hospital. Would like IOL at 41 weeks if no labor by then. She is doing all the things to encourage labor. GBS and GC/chlamydia swabs collected. RTC in one week.  [name], CNM

## 2021-12-19 LAB — STREP GP B NAA: Strep Gp B NAA: NEGATIVE

## 2021-12-21 LAB — CERVICOVAGINAL ANCILLARY ONLY
Chlamydia: NEGATIVE
Comment: NEGATIVE
Comment: NORMAL
Neisseria Gonorrhea: NEGATIVE

## 2021-12-27 ENCOUNTER — Encounter: Payer: Medicaid Other | Admitting: Certified Nurse Midwife

## 2021-12-29 ENCOUNTER — Ambulatory Visit (INDEPENDENT_AMBULATORY_CARE_PROVIDER_SITE_OTHER): Payer: Medicaid Other | Admitting: Certified Nurse Midwife

## 2021-12-29 ENCOUNTER — Encounter: Payer: Self-pay | Admitting: Certified Nurse Midwife

## 2021-12-29 VITALS — BP 93/56 | HR 71 | Wt 202.0 lb

## 2021-12-29 DIAGNOSIS — Z3A38 38 weeks gestation of pregnancy: Secondary | ICD-10-CM

## 2021-12-29 LAB — POCT URINALYSIS DIPSTICK OB
Bilirubin, UA: NEGATIVE
Blood, UA: NEGATIVE
Glucose, UA: NEGATIVE
Leukocytes, UA: NEGATIVE
Nitrite, UA: NEGATIVE
POC,PROTEIN,UA: NEGATIVE
Urobilinogen, UA: 0.2 E.U./dL
pH, UA: 7.5 (ref 5.0–8.0)

## 2021-12-29 NOTE — Patient Instructions (Signed)
Braxton Hicks Contractions  Contractions of the uterus can occur throughout pregnancy, but they are not always a sign that you are in labor. You may have practice contractions called Braxton Hicks contractions. These false labor contractions are sometimes confused with true labor. What are Braxton Hicks contractions? Braxton Hicks contractions are tightening movements that occur in the muscles of the uterus before labor. Unlike true labor contractions, these contractions do not result in opening (dilation) and thinning of the lowest part of the uterus (cervix). Toward the end of pregnancy (32-34 weeks), Braxton Hicks contractions can happen more often and may become stronger. These contractions are sometimes difficult to tell apart from true labor because they can be very uncomfortable. How to tell the difference between true labor and false labor True labor Contractions last 30-70 seconds. Contractions become very regular. Discomfort is usually felt in the top of the uterus, and it spreads to the lower abdomen and low back. Contractions do not go away with walking. Contractions usually become stronger and more frequent. The cervix dilates and gets thinner. False labor Contractions are usually shorter, weaker, and farther apart than true labor contractions. Contractions are usually irregular. Contractions are often felt in the front of the lower abdomen and in the groin. Contractions may go away when you walk around or change positions while lying down. The cervix usually does not dilate or become thin. Sometimes, the only way to tell if you are in true labor is for your health care provider to look for changes in your cervix. Your health care provider will do a physical exam and may monitor your contractions. If you are in true labor, your health care provider will send you home with instructions about when to return to the hospital. You may continue to have Braxton Hicks contractions until you  go into true labor. Follow these instructions at home:  Take over-the-counter and prescription medicines only as told by your health care provider. If Braxton Hicks contractions are making you uncomfortable: Change your position from lying down or resting to walking, or change from walking to resting. Sit and rest in a tub of warm water. Drink enough fluid to keep your urine pale yellow. Dehydration may cause these contractions. Do slow and deep breathing several times an hour. Keep all follow-up visits. This is important. Contact a health care provider if: You have a fever. You have continuous pain in your abdomen. Your contractions become stronger, more regular, and closer together. You pass blood-tinged mucus. Get help right away if: You have fluid leaking or gushing from your vagina. You have bright red blood coming from your vagina. Your baby is not moving inside you as much as it used to. Summary You may have practice contractions called Braxton Hicks contractions. These false labor contractions are sometimes confused with true labor. Braxton Hicks contractions are usually shorter, weaker, farther apart, and less regular than true labor contractions. True labor contractions usually become stronger, more regular, and more frequent. Manage discomfort from Braxton Hicks contractions by changing position, resting in a warm bath, practicing deep breathing, and drinking plenty of water. Keep all follow-up visits. Contact your health care provider if your contractions become stronger, more regular, and closer together. This information is not intended to replace advice given to you by your health care provider. Make sure you discuss any questions you have with your health care provider. Document Revised: 02/24/2020 Document Reviewed: 02/24/2020 Elsevier Patient Education  2023 Elsevier Inc.  

## 2021-12-29 NOTE — Progress Notes (Signed)
ROB- cervix check, no concerns 

## 2021-12-29 NOTE — Progress Notes (Signed)
ROB doing well, feeling good movement. Reviewed labor precautions. Pt is interested in induction in her 40th week rather than waiting until 41 wks. Will schedule as we get closer to her due date. She verbalizes and agrees to plan of care. SVE per pt request 1/70/-3. Follow up 1 wk with Missy .  Doreene Burke, CNM

## 2022-01-07 ENCOUNTER — Ambulatory Visit (INDEPENDENT_AMBULATORY_CARE_PROVIDER_SITE_OTHER): Payer: Medicaid Other | Admitting: Certified Nurse Midwife

## 2022-01-07 DIAGNOSIS — Z3483 Encounter for supervision of other normal pregnancy, third trimester: Secondary | ICD-10-CM

## 2022-01-07 DIAGNOSIS — Z3A40 40 weeks gestation of pregnancy: Secondary | ICD-10-CM | POA: Diagnosis not present

## 2022-01-07 DIAGNOSIS — Z3480 Encounter for supervision of other normal pregnancy, unspecified trimester: Secondary | ICD-10-CM | POA: Diagnosis not present

## 2022-01-07 NOTE — Progress Notes (Signed)
ROB doing well, feeling good movement. NST today for [redacted] wks gestation  NST reactive , catergory 1  Baseline 130 Accel present Decels absent Moderate variabiliyt  Ctx irregular   Pt has induction scheduled  9/12 @ 0500. Labor precautions reviewed.   Doreene Burke, CNM

## 2022-01-07 NOTE — Patient Instructions (Signed)
Braxton Hicks Contractions  Contractions of the uterus can occur throughout pregnancy, but they are not always a sign that you are in labor. You may have practice contractions called Braxton Hicks contractions. These false labor contractions are sometimes confused with true labor. What are Braxton Hicks contractions? Braxton Hicks contractions are tightening movements that occur in the muscles of the uterus before labor. Unlike true labor contractions, these contractions do not result in opening (dilation) and thinning of the lowest part of the uterus (cervix). Toward the end of pregnancy (32-34 weeks), Braxton Hicks contractions can happen more often and may become stronger. These contractions are sometimes difficult to tell apart from true labor because they can be very uncomfortable. How to tell the difference between true labor and false labor True labor Contractions last 30-70 seconds. Contractions become very regular. Discomfort is usually felt in the top of the uterus, and it spreads to the lower abdomen and low back. Contractions do not go away with walking. Contractions usually become stronger and more frequent. The cervix dilates and gets thinner. False labor Contractions are usually shorter, weaker, and farther apart than true labor contractions. Contractions are usually irregular. Contractions are often felt in the front of the lower abdomen and in the groin. Contractions may go away when you walk around or change positions while lying down. The cervix usually does not dilate or become thin. Sometimes, the only way to tell if you are in true labor is for your health care provider to look for changes in your cervix. Your health care provider will do a physical exam and may monitor your contractions. If you are in true labor, your health care provider will send you home with instructions about when to return to the hospital. You may continue to have Braxton Hicks contractions until you  go into true labor. Follow these instructions at home:  Take over-the-counter and prescription medicines only as told by your health care provider. If Braxton Hicks contractions are making you uncomfortable: Change your position from lying down or resting to walking, or change from walking to resting. Sit and rest in a tub of warm water. Drink enough fluid to keep your urine pale yellow. Dehydration may cause these contractions. Do slow and deep breathing several times an hour. Keep all follow-up visits. This is important. Contact a health care provider if: You have a fever. You have continuous pain in your abdomen. Your contractions become stronger, more regular, and closer together. You pass blood-tinged mucus. Get help right away if: You have fluid leaking or gushing from your vagina. You have bright red blood coming from your vagina. Your baby is not moving inside you as much as it used to. Summary You may have practice contractions called Braxton Hicks contractions. These false labor contractions are sometimes confused with true labor. Braxton Hicks contractions are usually shorter, weaker, farther apart, and less regular than true labor contractions. True labor contractions usually become stronger, more regular, and more frequent. Manage discomfort from Braxton Hicks contractions by changing position, resting in a warm bath, practicing deep breathing, and drinking plenty of water. Keep all follow-up visits. Contact your health care provider if your contractions become stronger, more regular, and closer together. This information is not intended to replace advice given to you by your health care provider. Make sure you discuss any questions you have with your health care provider. Document Revised: 02/24/2020 Document Reviewed: 02/24/2020 Elsevier Patient Education  2023 Elsevier Inc.  

## 2022-01-09 NOTE — Addendum Note (Signed)
Addended by: Mechele Claude on: 01/09/2022 06:15 PM   Modules accepted: Orders

## 2022-01-10 ENCOUNTER — Encounter: Payer: Self-pay | Admitting: Certified Nurse Midwife

## 2022-01-10 ENCOUNTER — Other Ambulatory Visit: Payer: Self-pay | Admitting: Obstetrics

## 2022-01-11 ENCOUNTER — Inpatient Hospital Stay: Payer: Medicaid Other | Admitting: Anesthesiology

## 2022-01-11 ENCOUNTER — Other Ambulatory Visit: Payer: Self-pay

## 2022-01-11 ENCOUNTER — Encounter: Payer: Self-pay | Admitting: Obstetrics and Gynecology

## 2022-01-11 ENCOUNTER — Inpatient Hospital Stay
Admission: EM | Admit: 2022-01-11 | Discharge: 2022-01-12 | DRG: 807 | Disposition: A | Discharge status: Home / Self Care | Payer: Medicaid Other | Attending: Certified Nurse Midwife | Admitting: Certified Nurse Midwife

## 2022-01-11 DIAGNOSIS — O26893 Other specified pregnancy related conditions, third trimester: Secondary | ICD-10-CM | POA: Diagnosis not present

## 2022-01-11 DIAGNOSIS — Z3A40 40 weeks gestation of pregnancy: Secondary | ICD-10-CM

## 2022-01-11 DIAGNOSIS — O480 Post-term pregnancy: Principal | ICD-10-CM | POA: Diagnosis present

## 2022-01-11 LAB — CBC
HCT: 37.8 % (ref 36.0–46.0)
Hemoglobin: 12.5 g/dL (ref 12.0–15.0)
MCH: 28.1 pg (ref 26.0–34.0)
MCHC: 33.1 g/dL (ref 30.0–36.0)
MCV: 84.9 fL (ref 80.0–100.0)
Platelets: 249 10*3/uL (ref 150–400)
RBC: 4.45 MIL/uL (ref 3.87–5.11)
RDW: 14.6 % (ref 11.5–15.5)
WBC: 16.4 10*3/uL — ABNORMAL HIGH (ref 4.0–10.5)
nRBC: 0 % (ref 0.0–0.2)

## 2022-01-11 LAB — TYPE AND SCREEN
ABO/RH(D): A POS
Antibody Screen: NEGATIVE

## 2022-01-11 MED ORDER — PHENYLEPHRINE 80 MCG/ML (10ML) SYRINGE FOR IV PUSH (FOR BLOOD PRESSURE SUPPORT): 80.0000 ug | PREFILLED_SYRINGE | INTRAVENOUS | Status: DC | PRN

## 2022-01-11 MED ORDER — METHYLERGONOVINE MALEATE 0.2 MG/ML IJ SOLN: 0.2000 mg | INTRAMUSCULAR | Status: DC | PRN

## 2022-01-11 MED ORDER — SENNOSIDES-DOCUSATE SODIUM 8.6-50 MG PO TABS
2.0000 | ORAL_TABLET | ORAL | Status: DC
  Administered 2022-01-11: 2 via ORAL
  Filled 2022-01-11: qty 2

## 2022-01-11 MED ORDER — DIPHENHYDRAMINE HCL 50 MG/ML IJ SOLN: 12.5000 mg | INTRAMUSCULAR | Status: DC | PRN

## 2022-01-11 MED ORDER — COCONUT OIL OIL: 1.0000 | TOPICAL_OIL | Status: DC | PRN

## 2022-01-11 MED ORDER — SIMETHICONE 80 MG PO CHEW: 80.0000 mg | CHEWABLE_TABLET | ORAL | Status: DC | PRN

## 2022-01-11 MED ORDER — OXYTOCIN 10 UNIT/ML IJ SOLN
INTRAMUSCULAR | Status: AC
  Filled 2022-01-11: qty 2

## 2022-01-11 MED ORDER — FERROUS SULFATE 325 (65 FE) MG PO TABS
325.0000 mg | ORAL_TABLET | Freq: Every day | ORAL | Status: DC
  Administered 2022-01-12: 325 mg via ORAL
  Filled 2022-01-11: qty 1

## 2022-01-11 MED ORDER — ONDANSETRON HCL 4 MG PO TABS: 4.0000 mg | ORAL_TABLET | ORAL | Status: DC | PRN

## 2022-01-11 MED ORDER — OXYCODONE-ACETAMINOPHEN 5-325 MG PO TABS: 1.0000 | ORAL_TABLET | ORAL | Status: DC | PRN

## 2022-01-11 MED ORDER — BENZOCAINE-MENTHOL 20-0.5 % EX AERO
1.0000 | INHALATION_SPRAY | CUTANEOUS | Status: DC | PRN
  Administered 2022-01-11: 1 via TOPICAL
  Filled 2022-01-11: qty 56

## 2022-01-11 MED ORDER — LIDOCAINE HCL (PF) 1 % IJ SOLN
INTRAMUSCULAR | Status: DC | PRN
  Administered 2022-01-11: 3 mL

## 2022-01-11 MED ORDER — MISOPROSTOL 25 MCG QUARTER TABLET: 25.0000 ug | ORAL_TABLET | Freq: Once | ORAL | Status: DC

## 2022-01-11 MED ORDER — FENTANYL CITRATE (PF) 100 MCG/2ML IJ SOLN: 50.0000 ug | INTRAMUSCULAR | Status: DC | PRN

## 2022-01-11 MED ORDER — FENTANYL-BUPIVACAINE-NACL 0.5-0.125-0.9 MG/250ML-% EP SOLN
EPIDURAL | Status: AC
  Filled 2022-01-11: qty 250

## 2022-01-11 MED ORDER — METHYLERGONOVINE MALEATE 0.2 MG PO TABS: 0.2000 mg | ORAL_TABLET | ORAL | Status: DC | PRN

## 2022-01-11 MED ORDER — OXYCODONE-ACETAMINOPHEN 5-325 MG PO TABS: 2.0000 | ORAL_TABLET | ORAL | Status: DC | PRN

## 2022-01-11 MED ORDER — AMMONIA AROMATIC IN INHA
RESPIRATORY_TRACT | Status: AC
  Filled 2022-01-11: qty 10

## 2022-01-11 MED ORDER — OXYTOCIN BOLUS FROM INFUSION
333.0000 mL | Freq: Once | INTRAVENOUS | Status: AC
  Administered 2022-01-11: 333 mL via INTRAVENOUS

## 2022-01-11 MED ORDER — FENTANYL-BUPIVACAINE-NACL 0.5-0.125-0.9 MG/250ML-% EP SOLN
EPIDURAL | Status: DC | PRN
  Administered 2022-01-11: 12 mL/h via EPIDURAL

## 2022-01-11 MED ORDER — ONDANSETRON HCL 4 MG/2ML IJ SOLN: 4.0000 mg | INTRAMUSCULAR | Status: DC | PRN

## 2022-01-11 MED ORDER — PRENATAL MULTIVITAMIN CH
1.0000 | ORAL_TABLET | Freq: Every day | ORAL | Status: DC
  Administered 2022-01-12: 1 via ORAL
  Filled 2022-01-11: qty 1

## 2022-01-11 MED ORDER — LIDOCAINE HCL (PF) 1 % IJ SOLN
INTRAMUSCULAR | Status: AC
  Filled 2022-01-11: qty 30

## 2022-01-11 MED ORDER — LACTATED RINGERS IV SOLN: 500.0000 mL | INTRAVENOUS | Status: DC | PRN

## 2022-01-11 MED ORDER — IBUPROFEN 600 MG PO TABS
600.0000 mg | ORAL_TABLET | Freq: Four times a day (QID) | ORAL | Status: DC
  Administered 2022-01-11 – 2022-01-12 (×4): 600 mg via ORAL
  Filled 2022-01-11 (×4): qty 1

## 2022-01-11 MED ORDER — MISOPROSTOL 50MCG HALF TABLET: 50.0000 ug | ORAL_TABLET | Freq: Once | ORAL | Status: DC

## 2022-01-11 MED ORDER — MISOPROSTOL 200 MCG PO TABS
ORAL_TABLET | ORAL | Status: AC
  Filled 2022-01-11: qty 4

## 2022-01-11 MED ORDER — ACETAMINOPHEN 325 MG PO TABS
650.0000 mg | ORAL_TABLET | ORAL | Status: DC | PRN
  Administered 2022-01-11 – 2022-01-12 (×4): 650 mg via ORAL
  Filled 2022-01-11 (×4): qty 2

## 2022-01-11 MED ORDER — WITCH HAZEL-GLYCERIN EX PADS
1.0000 | MEDICATED_PAD | CUTANEOUS | Status: DC | PRN
  Administered 2022-01-11: 1 via TOPICAL
  Filled 2022-01-11: qty 100

## 2022-01-11 MED ORDER — OXYTOCIN-SODIUM CHLORIDE 30-0.9 UT/500ML-% IV SOLN
2.5000 [IU]/h | INTRAVENOUS | Status: DC
  Filled 2022-01-11: qty 500

## 2022-01-11 MED ORDER — DIBUCAINE (PERIANAL) 1 % EX OINT: 1.0000 | TOPICAL_OINTMENT | CUTANEOUS | Status: DC | PRN

## 2022-01-11 MED ORDER — LACTATED RINGERS IV SOLN: 500.0000 mL | Freq: Once | INTRAVENOUS | Status: DC

## 2022-01-11 MED ORDER — FENTANYL-BUPIVACAINE-NACL 0.5-0.125-0.9 MG/250ML-% EP SOLN: 12.0000 mL/h | EPIDURAL | Status: DC | PRN

## 2022-01-11 MED ORDER — EPHEDRINE 5 MG/ML INJ: 10.0000 mg | INTRAVENOUS | Status: DC | PRN

## 2022-01-11 MED ORDER — TERBUTALINE SULFATE 1 MG/ML IJ SOLN: 0.2500 mg | Freq: Once | INTRAMUSCULAR | Status: DC | PRN

## 2022-01-11 MED ORDER — LIDOCAINE HCL (PF) 1 % IJ SOLN: 30.0000 mL | INTRAMUSCULAR | Status: DC | PRN

## 2022-01-11 MED ORDER — BUPIVACAINE HCL (PF) 0.25 % IJ SOLN
INTRAMUSCULAR | Status: DC | PRN
  Administered 2022-01-11 (×2): 4 mL via EPIDURAL

## 2022-01-11 MED ORDER — LACTATED RINGERS IV SOLN: INTRAVENOUS | Status: DC

## 2022-01-11 MED ORDER — LIDOCAINE-EPINEPHRINE (PF) 1.5 %-1:200000 IJ SOLN
INTRAMUSCULAR | Status: DC | PRN
  Administered 2022-01-11: 3 mL via PERINEURAL

## 2022-01-11 MED ORDER — DOCUSATE SODIUM 100 MG PO CAPS
100.0000 mg | ORAL_CAPSULE | Freq: Two times a day (BID) | ORAL | Status: DC
  Administered 2022-01-12: 100 mg via ORAL
  Filled 2022-01-11: qty 1

## 2022-01-11 NOTE — Progress Notes (Signed)
LABOR NOTE   Alicia Stephens 31 y.o.GP@ at [redacted]w[redacted]d  SUBJECTIVE:  Feeling some pressure Analgesia: Epidural  OBJECTIVE:  BP 117/68   Pulse 95   Temp 98 F (36.7 C) (Oral)   Resp 18   LMP 04/12/2021 (Exact Date)   SpO2 96%  Total I/O In: -  Out: 100 [Emesis/NG output:100]  She has shown cervical change. CERVIX: 10 cm:  100%:   -1:   not felt:    SVE:   Dilation: 5 Effacement (%): 100 Station: -2 Exam by:: A Tavarion Babington CNM CONTRACTIONS: regular, every  2 minutes FHR: Fetal heart tracing reviewed. Baseline: 125 bpm, Variability: Good {> 6 bpm), Accelerations: Reactive, and Decelerations: Absent Category I    Labs: Lab Results  Component Value Date   WBC 16.4 (H) 01/11/2022   HGB 12.5 01/11/2022   HCT 37.8 01/11/2022   MCV 84.9 01/11/2022   PLT 249 01/11/2022    ASSESSMENT: 1) Labor curve reviewed.       Progress: Active phase labor.     Membranes: ruptured, clear fluid           Active Problems:   Labor and delivery, indication for care   PLAN: Start pushing    Doreene Burke, [place]  01/11/2022 12:56 PM

## 2022-01-11 NOTE — Anesthesia Procedure Notes (Signed)
Epidural Patient location during procedure: OB Start time: 01/11/2022 9:15 AM End time: 01/11/2022 9:20 AM  Staffing Anesthesiologist: Lenard Simmer, MD Resident/CRNA: Hezzie Bump, CRNA Performed: resident/CRNA   Preanesthetic Checklist Completed: patient identified, IV checked, site marked, risks and benefits discussed, surgical consent, monitors and equipment checked, pre-op evaluation and timeout performed  Epidural Patient position: sitting Prep: ChloraPrep Patient monitoring: heart rate, continuous pulse ox and blood pressure Approach: midline Location: L3-L4 Injection technique: LOR saline  Needle:  Needle type: Tuohy  Needle gauge: 17 G Needle length: 9 cm and 9 Needle insertion depth: 7 cm Catheter type: closed end flexible Catheter size: 19 Gauge Catheter at skin depth: 13 cm Test dose: negative and 1.5% lidocaine with Epi 1:200 K  Assessment Sensory level: T10 Events: blood not aspirated, injection not painful, no injection resistance, no paresthesia and negative IV test  Additional Notes 1 attempt Pt. Evaluated and documentation done after procedure finished. Patient identified. Risks/Benefits/Options discussed with patient including but not limited to bleeding, infection, nerve damage, paralysis, failed block, incomplete pain control, headache, blood pressure changes, nausea, vomiting, reactions to medication both or allergic, itching and postpartum back pain. Confirmed with bedside nurse the patient's most recent platelet count. Confirmed with patient that they are not currently taking any anticoagulation, have any bleeding history or any family history of bleeding disorders. Patient expressed understanding and wished to proceed. All questions were answered. Sterile technique was used throughout the entire procedure. Please see nursing notes for vital signs. Test dose was given through epidural catheter and negative prior to continuing to dose epidural or start  infusion. Warning signs of high block given to the patient including shortness of breath, tingling/numbness in hands, complete motor block, or any concerning symptoms with instructions to call for help. Patient was given instructions on fall risk and not to get out of bed. All questions and concerns addressed with instructions to call with any issues or inadequate analgesia.    Patient tolerated the insertion well without immediate complications.Reason for block:procedure for pain

## 2022-01-11 NOTE — Discharge Summary (Signed)
Postpartum Discharge Summary  Date of Service updated: 01/12/2022     Patient Name: Alicia Stephens DOB: May 13, 1990 MRN: [id number]  Date of admission: 01/11/2022 Delivery date:01/11/2022  Delivering provider: Philip Aspen  Date of discharge:   Admitting diagnosis: Labor and delivery, indication for care [O75.9] Intrauterine pregnancy: [redacted]w[redacted]d    Secondary diagnosis:  Active Problems:   Labor and delivery, indication for care   [redacted] weeks gestation of pregnancy   Postpartum care following vaginal delivery   Encounter for care or examination of lactating mother  Additional problems: none    Discharge diagnosis: Term Pregnancy Delivered                                              Post partum procedures: none Augmentation: AROM Complications: None  Hospital course: Onset of Labor With Vaginal Delivery      31y.o. yo [id number] admitted in Active Labor on 01/11/2022. Patient had an uncomplicated labor course as follows:  Membrane Rupture Time/Date: 9:47 AM ,01/11/2022   Delivery Method:Vaginal, Spontaneous  Episiotomy:   Lacerations:     Patient had an uncomplicated postpartum course.  She is ambulating, tolerating a regular diet, passing flatus, and urinating well.   Patient is discharged home in stable condition on 01/12/22.  Newborn Data: Birth date:01/11/2022  Birth time:1:12 PM  Gender:Female  Living status:Living  Apgars:8 ,9  Weight:4040 g   Magnesium Sulfate received: No BMZ received: No Rhophylac:N/A MMR:No T-DaP:Given prenatally Flu: No Transfusion:No  Physical exam  Vitals:   01/11/22 1910 01/11/22 2316 01/12/22 0325 01/12/22 0839  BP: 127/70 110/72 104/69 (!) 99/54  Pulse: 79 77 64 (!) 55  Resp: 18 18 18 20   Temp: 98.6 F (37 C) 97.6 F (36.4 C) 97.6 F (36.4 C) 98 F (36.7 C)  TempSrc: Oral   Oral  SpO2: 99% 100% 99%    General: alert, cooperative, and no distress Lochia: appropriate Uterine Fundus: firm Incision: N/A DVT  Evaluation: No evidence of DVT seen on physical exam. Labs: Lab Results  Component Value Date   WBC 15.1 (H) 01/12/2022   HGB 11.9 (L) 01/12/2022   HCT 36.5 01/12/2022   MCV 87.7 01/12/2022   PLT 236 01/12/2022      Latest Ref Rng & Units 08/14/2019   11:03 AM  CMP  Glucose 65 - 99 mg/dL 83   BUN 6 - 20 mg/dL 10   Creatinine 0.57 - 1.00 mg/dL 0.60   Sodium 134 - 144 mmol/L 135   Potassium 3.5 - 5.2 mmol/L 4.4   Chloride 96 - 106 mmol/L 103   CO2 20 - 29 mmol/L 18   Calcium 8.7 - 10.2 mg/dL 9.1   Total Protein 6.0 - 8.5 g/dL 6.2   Total Bilirubin 0.0 - 1.2 mg/dL 0.4   Alkaline Phos 39 - 117 IU/L 129   AST 0 - 40 IU/L 16   ALT 0 - 32 IU/L 10    Edinburgh Score:    01/12/2022    9:59 AM  Edinburgh Postnatal Depression Scale Screening Tool  I have been able to laugh and see the funny side of things. 0  I have looked forward with enjoyment to things. 0  I have blamed myself unnecessarily when things went wrong. 2  I have been anxious or worried for no good reason. 2  I have felt scared or panicky for no good reason. 1  Things have been getting on top of me. 1  I have been so unhappy that I have had difficulty sleeping. 1  I have felt sad or miserable. 1  I have been so unhappy that I have been crying. 1  The thought of harming myself has occurred to me. 0  Edinburgh Postnatal Depression Scale Total 9      After visit meds:  Allergies as of 01/12/2022   No Known Allergies      Medication List     STOP taking these medications    ASHWAGANDHA PO   esomeprazole 20 MG capsule Commonly known as: NEXIUM       TAKE these medications    Vitafol FE+ 90-0.6-0.4-200 MG Caps Take 1 tablet by mouth daily.         Discharge home in stable condition Infant Feeding: Breast Infant Disposition:home with mother Discharge instruction: per After Visit Summary and Postpartum booklet. Activity: Advance as tolerated. Pelvic rest for 6 weeks.  Diet: routine  diet Anticipated Birth Control: POPs Postpartum Appointment:2 weeks Additional Postpartum F/U:  6 weeks Future Appointments:No future appointments. Follow up Visit:  Follow-up Information     Philip Aspen, CNM. Schedule an appointment as soon as possible for a visit in 2 week(s).   Specialties: Certified Nurse Midwife, Radiology Why: 2 week video visit and 6 week postpartum visit Contact information: [place] [address] [place], [place] [phone], 10:35 AM

## 2022-01-11 NOTE — H&P (Signed)
Alicia Stephens is a 31 y.o. female presenting for labor. She was scheduled for induction this morning, but arrived in latent phase labor. She denies any LOF  and her baby has been moving well. Her husband is present and supportive. OB History     Gravida  2   Para  1   Term  1   Preterm      AB      Living  1      SAB      IAB      Ectopic      Multiple  0   Live Births  1          Past Medical History:  Diagnosis Date   Breast mass    No past surgical history on file. Family History: family history includes Breast cancer in her paternal grandmother; Diabetes in an other family member. Social History:  reports that she has never smoked. She has never used smokeless tobacco. She reports that she does not currently use alcohol. She reports that she does not currently use drugs after having used the following drugs: Marijuana.     Maternal Diabetes: No Genetic Screening: Normal Maternal Ultrasounds/Referrals: Normal Fetal Ultrasounds or other Referrals:  None Maternal Substance Abuse:  No Significant Maternal Medications:  None Significant Maternal Lab Results:  Group B Strep negative Number of Prenatal Visits:greater than 3 verified prenatal visits Other Comments:  None  Review of Systems  Constitutional: Negative.   HENT: Negative.    Eyes: Negative.   Respiratory: Negative.    Cardiovascular: Negative.   Gastrointestinal: Negative.        Gravid abdomen  Endocrine: Negative.   Genitourinary: Negative.   Musculoskeletal: Negative.   Allergic/Immunologic: Negative.   Neurological: Negative.   Hematological: Negative.   Psychiatric/Behavioral: Negative.     History Dilation: 3.5 Effacement (%): 90 Station: -3 Exam by:: Alicia Stephens CNM Blood pressure 114/70, pulse 93, temperature 98 F (36.7 C), temperature source Oral, resp. rate 18, last menstrual period 04/12/2021. Maternal Exam:  Uterine Assessment: Contraction strength is mild.   Contraction duration is 1 minute. Contraction frequency is regular.  Abdomen: Fetal presentation: vertex Introitus: Normal vulva. Normal vagina.  Pelvis: adequate for delivery.   Cervix: Cervix evaluated by digital exam.    EFM: FHT baseline135, moderate variability, accels present, decels absent. Category 1  Physical Exam Constitutional:      Appearance: Normal appearance.  HENT:     Head: Normocephalic and atraumatic.  Cardiovascular:     Rate and Rhythm: Normal rate and regular rhythm.     Heart sounds: Normal heart sounds.  Pulmonary:     Breath sounds: Normal breath sounds.  Abdominal:     Comments: Gravid abdomen  Genitourinary:    General: Normal vulva.     Rectum: Normal.     Comments: SVE: 3.5cms/90%/-3-2 Musculoskeletal:        General: Normal range of motion.     Cervical back: Normal range of motion and neck supple.  Skin:    General: Skin is warm and dry.  Neurological:     General: No focal deficit present.     Mental Status: She is alert and oriented to person, place, and time.  Psychiatric:        Mood and Affect: Mood normal.        Behavior: Behavior normal.     Prenatal labs: ABO, Rh: A/Positive/-- (02/28 0941) Antibody: Negative (02/28 0941) Rubella: 1.59 (02/28 0941) RPR:  Non Reactive (06/13 0945)  HBsAg: Negative (02/28 0941)  HIV:    GBS: Negative/-- (08/18 1221)   Assessment/Plan: IUP 40 weeks 4 days gestation Regular contractions Category 1 FHTs GBS negative Latent phase labor.  Plan: Admitted for labor EFM continuous IV access  Routine orders and labs She desires epidural Dr. Logan Stephens is notified Anticipate SVD.  Alicia Stephens, CNM  01/11/2022 7:37 AM     Alicia Stephens 01/11/2022, 7:17 AM

## 2022-01-11 NOTE — Progress Notes (Signed)
LABOR NOTE   Alicia Stephens 31 y.o.GP@ at [redacted]w[redacted]d  SUBJECTIVE:  Comfortable with epidural  Analgesia: Epidural  OBJECTIVE:  BP 117/68   Pulse 95   Temp 98 F (36.7 C) (Oral)   Resp 18   LMP 04/12/2021 (Exact Date)   SpO2 96%  Total I/O In: -  Out: 100 [Emesis/NG output:100]  She has shown cervical change. CERVIX: 5 cm:  100%:   -2:   mid position:   soft SVE:   Dilation: 5 Effacement (%): 100 Station: -2 Exam by:: A Lelia Jons CNM CONTRACTIONS: regular, every 1-2 minutes FHR: Fetal heart tracing reviewed. Baseline: 120 bpm, Variability: Good {> 6 bpm), Accelerations: Reactive, and Decelerations: Absent Category I    Labs: Lab Results  Component Value Date   WBC 16.4 (H) 01/11/2022   HGB 12.5 01/11/2022   HCT 37.8 01/11/2022   MCV 84.9 01/11/2022   PLT 249 01/11/2022    ASSESSMENT: 1) Labor curve reviewed.       Progress: Active phase labor.     Membranes: ruptured AROM, clear fluid           Active Problems:   Labor and delivery, indication for care   PLAN: continue present management  Doreene Burke, CNM  01/11/2022 9:56 AM

## 2022-01-11 NOTE — Anesthesia Preprocedure Evaluation (Signed)
Anesthesia Evaluation  Patient identified by MRN, date of birth, ID band Patient awake    Reviewed: Allergy & Precautions, H&P , NPO status , Patient's Chart, lab work & pertinent test results  Airway Mallampati: I       Dental no notable dental hx.    Pulmonary neg pulmonary ROS,    Pulmonary exam normal        Cardiovascular negative cardio ROS Normal cardiovascular exam     Neuro/Psych negative neurological ROS  negative psych ROS   GI/Hepatic negative GI ROS, Neg liver ROS,   Endo/Other  negative endocrine ROS  Renal/GU negative Renal ROS  negative genitourinary   Musculoskeletal   Abdominal   Peds  Hematology negative hematology ROS (+)   Anesthesia Other Findings   Reproductive/Obstetrics (+) Pregnancy                             Anesthesia Physical Anesthesia Plan  ASA: 1  Anesthesia Plan: Epidural   Post-op Pain Management:    Induction:   PONV Risk Score and Plan:   Airway Management Planned:   Additional Equipment:   Intra-op Plan:   Post-operative Plan:   Informed Consent: I have reviewed the patients History and Physical, chart, labs and discussed the procedure including the risks, benefits and alternatives for the proposed anesthesia with the patient or authorized representative who has indicated his/her understanding and acceptance.       Plan Discussed with: Anesthesiologist and CRNA  Anesthesia Plan Comments:         Anesthesia Quick Evaluation

## 2022-01-11 NOTE — OB Triage Note (Signed)
Pt c/o ctx for the last 3 2-3 days with them increasing this morning to every 6-8 mins apart. Pain 7-8 on pain scale.

## 2022-01-12 LAB — CBC
HCT: 36.5 % (ref 36.0–46.0)
Hemoglobin: 11.9 g/dL — ABNORMAL LOW (ref 12.0–15.0)
MCH: 28.6 pg (ref 26.0–34.0)
MCHC: 32.6 g/dL (ref 30.0–36.0)
MCV: 87.7 fL (ref 80.0–100.0)
Platelets: 236 10*3/uL (ref 150–400)
RBC: 4.16 MIL/uL (ref 3.87–5.11)
RDW: 14.6 % (ref 11.5–15.5)
WBC: 15.1 10*3/uL — ABNORMAL HIGH (ref 4.0–10.5)
nRBC: 0 % (ref 0.0–0.2)

## 2022-01-12 LAB — RPR: RPR Ser Ql: NONREACTIVE

## 2022-01-12 NOTE — Lactation Note (Signed)
This note was copied from a baby's chart. Lactation Consultation Note  Patient Name: Alicia Stephens Today's Date: 01/12/2022 Reason for consult: Initial assessment;Term Age:31 hours  Maternal Data This is mom's 2nd baby, born SVD. Mom is an experienced breastfeeding mother. Mom with history of breast mass. Per mom's report the mass resolved on its own and mom was under the care of her Provider while having that concern.Her plan is to breastfeed her baby and offer some expressed milk bottles  in a few weeks. Does the patient have breastfeeding experience prior to this delivery?: Yes How long did the patient breastfeed?: 10 months  Feeding Mother's Current Feeding Choice: Breast Milk Per mom's report baby is latching and breastfeeding well. Mom does not have any c/o nipple pain or soreness.  Interventions Interventions: Breast feeding basics reviewed;Education As mom plans to introduce a bottle recommended mom consider doing so by 6-8 weeks of baby's life.  Discharge Discharge Education: Warning signs for feeding baby (Mom reports she did not experience engorgement with her first baby. Mom aware if she has questions once she goes home or would like further assistance with breastfeeding to call [facility] lactation.Mom will bring baby for follow-up at [facility] clinic.) Pump: Personal  Consult Status Consult Status: Complete  Update provided to care nurse.  Fuller Song 01/12/2022, 11:59 AM

## 2022-01-12 NOTE — Anesthesia Postprocedure Evaluation (Signed)
Anesthesia Post Note  Patient: Alicia Stephens  Procedure(s) Performed: AN AD HOC LABOR EPIDURAL  Patient location during evaluation: Mother Baby Anesthesia Type: Epidural Level of consciousness: awake and alert Pain management: pain level controlled Vital Signs Assessment: post-procedure vital signs reviewed and stable Respiratory status: spontaneous breathing, nonlabored ventilation and respiratory function stable Cardiovascular status: stable Postop Assessment: no headache, no backache and epidural receding Anesthetic complications: no   No notable events documented.   Last Vitals:  Vitals:   01/11/22 2316 01/12/22 0325  BP: 110/72 104/69  Pulse: 77 64  Resp: 18 18  Temp: 36.4 C 36.4 C  SpO2: 100% 99%    Last Pain:  Vitals:   01/12/22 0328  TempSrc:   PainSc: 2                  [name] Joanette Gula

## 2022-01-12 NOTE — Progress Notes (Signed)
Mother discharged. Discharge instructions given. Mother verbalizes understanding. Transported by axillary.  

## 2022-01-14 ENCOUNTER — Encounter: Payer: Self-pay | Admitting: Certified Nurse Midwife

## 2022-01-26 ENCOUNTER — Telehealth: Payer: Medicaid Other | Admitting: Certified Nurse Midwife

## 2022-01-28 ENCOUNTER — Encounter: Payer: Self-pay | Admitting: Certified Nurse Midwife

## 2022-01-31 NOTE — Progress Notes (Signed)
No show

## 2022-02-04 ENCOUNTER — Encounter: Payer: Self-pay | Admitting: Certified Nurse Midwife

## 2022-02-15 ENCOUNTER — Encounter: Payer: Self-pay | Admitting: Certified Nurse Midwife

## 2022-02-22 ENCOUNTER — Ambulatory Visit (INDEPENDENT_AMBULATORY_CARE_PROVIDER_SITE_OTHER): Payer: Medicaid Other | Admitting: Certified Nurse Midwife

## 2022-02-22 ENCOUNTER — Encounter: Payer: Self-pay | Admitting: Certified Nurse Midwife

## 2022-02-22 NOTE — Progress Notes (Signed)
Subjective:    Alicia Stephens is a 31 y.o. [id number] Caucasian female who presents for a postpartum visit. She is 6 weeks postpartum following a spontaneous vaginal delivery at 40.4 gestational weeks. Anesthesia: epidural. I have fully reviewed the prenatal and intrapartum course. Postpartum course has been normal . Baby's course has been normal. Baby is feeding by breast. Bleeding no bleeding. Bowel function is normal. Bladder function is normal. Patient is not sexually active.. Contraception method is condoms. Considering combined pill once she has completed breastfeeding. Postpartum depression screening: negative. Score negative.  Last pap 07/14/2021 and was ASCU/ + HPV 16.  The following portions of the patient's history were reviewed and updated as appropriate: allergies, current medications, past medical history, past surgical history and problem list.  Review of Systems Pertinent items are noted in HPI.   Vitals:   02/22/22 1527  BP: 105/68  Pulse: (!) 57  Resp: 15  SpO2: 98%  Weight: 187 lb (84.8 kg)  Height: 5\' 9"  (1.753 m)   No LMP recorded.  Objective:   General:  alert, cooperative and no distress   Breasts:  deferred, no complaints  Lungs: clear to auscultation bilaterally  Heart:  regular rate and rhythm  Abdomen: soft, nontender   Vulva: normal  Vagina: normal vagina  Cervix:  closed  Corpus: Well-involuted  Adnexa:  Non-palpable  Rectal Exam: no hemorrhoids        Assessment:   Postpartum exam 6 wks s/p SVD Breastfeeding Depression screening Contraception counseling   Plan:  : condoms Follow up in:  with 3 months for pap   or earlier if needed  Philip Aspen,, CNM

## 2022-02-22 NOTE — Patient Instructions (Signed)
Colposcopy  Colposcopy is a procedure to examine the lowest part of the uterus (cervix) for abnormalities or signs of disease. This procedure is done using an instrument that makes objects appear larger and provides light (colposcope). During the procedure, your health care provider may remove a tissue sample to look at under a microscope (biopsy). A biopsy may be done if any unusual cells are seen during the colposcopy. You may have a colposcopy if you have: An abnormal Pap smear, also called a Pap test. This screening test is used to check for signs of cancer or infection of the vagina, cervix, and uterus. An HPV (human papillomavirus) test and get a positive result for a type of HPV that puts you at high risk of cancer. Certain conditions or symptoms, such as: A sore, or lesion, on your cervix. Genital warts on your vulva, vagina, or cervix. Pain during sex. Vaginal bleeding, especially after sex. A growth on your cervix (cervical polyp) that needs to be removed. Let your health care provider know about: Any allergies you have, including allergies to medicines, latex, or iodine. All medicines you are taking, including vitamins, herbs, eye drops, creams, and over-the-counter medicines. Any bleeding problems you have. Any surgeries you have had. Any medical conditions you have, such as pelvic inflammatory disease (PID) or an endometrial disorder. The pattern of your menstrual cycles and the form of birth control (contraception) you use, if any. Your medical history, including any cervical treatments and how well you tolerated the procedure (if you have ever fainted). Whether you are pregnant or may be pregnant. What are the risks? Generally, this is a safe procedure. However, problems may occur, including: Infection. Symptoms of infection may include fever, bad-smelling vaginal discharge, or pelvic pain. Vaginal bleeding. Allergic reactions to medicines. Damage to nearby structures or  organs. What happens before the procedure? Medicines Ask your health care provider about: Changing or stopping your regular medicines. This is especially important if you are taking diabetes medicines or blood thinners. Taking medicines such as aspirin and ibuprofen. These medicines can thin your blood. Do not take these medicines unless your health care provider tells you to take them. Your health care provider will likely tell you to avoid taking aspirin, or medicine that contains aspirin, for 7 days before the procedure. Taking over-the-counter medicines, vitamins, herbs, and supplements. General instructions Tell your health care provider if you have your menstrual period now or will have it at the time of your procedure. A colposcopy is not normally done during your menstrual period. If you use contraception, continue to use it before your procedure. For 24 hours before the procedure: Do not use douche products or tampons. Do not use medicines, creams, or suppositories in the vagina. Do not have sex or insert anything into your vagina. Ask your health care provider what steps will be taken to prevent infection. What happens during the procedure? You will lie down on your back, with your feet in foot rests (stirrups). An instrument called a speculum will be inserted into your vagina. This will be used so your health care provider can see your cervix and the inside of your vagina. A cotton swab will be used to place a small amount of a liquid (solution) on the areas to be examined. This solution makes it easier to see abnormal cells. You may feel a slight burning during this part. The colposcope will be used to scan the cervix with a bright white light. The colposcope will be held near   your vulva and will make your vulva, vagina, and cervix look bigger so they can be seen better. If a biopsy is needed: You may be given a medicine to numb the area (local anesthetic). Surgical tools will be  used to remove mucus and cells through your vagina. You may feel mild pain while the tissue sample is removed. Bleeding may occur. A solution may be used to stop the bleeding. The tissue removed will be sent to a lab to be looked at under a microscope. The procedure may vary among health care providers and hospitals. What happens after the procedure? You may have some cramping in your abdomen. This should go away after a few minutes. It is up to you to get the results of your procedure. Ask your health care provider, or the department that is doing the procedure, when your results will be ready. Summary Colposcopy is a procedure to examine the lowest part of the uterus (cervix), for signs of disease. A biopsy may be done as part of the procedure. You may have some cramping in your abdomen. This should go away after a few minutes. It is up to you to get the results of your procedure. Ask your health care provider, or the department that is doing the procedure, when your results will be ready. This information is not intended to replace advice given to you by your health care provider. Make sure you discuss any questions you have with your health care provider. Document Revised: 09/13/2020 Document Reviewed: 09/13/2020 Elsevier Patient Education  2023 Elsevier Inc.  

## 2022-04-11 ENCOUNTER — Telehealth: Payer: Self-pay | Admitting: Certified Nurse Midwife

## 2022-04-11 NOTE — Telephone Encounter (Signed)
Reached out to pt to reschedule Jan. 24 appt with Doreene Burke because of a scheduled change.  Wanting to reschedule the time of the appt from 3:15 to 3:35.  Will send a MyChart message.

## 2022-04-21 NOTE — Telephone Encounter (Signed)
Pt was rescheduled to 3:35 on Jan. 24th, 2024.

## 2022-05-10 ENCOUNTER — Telehealth: Payer: Self-pay | Admitting: Certified Nurse Midwife

## 2022-05-10 NOTE — Telephone Encounter (Signed)
Patient was scheduled for 1/24 with Philip Aspen. Her scheduled change and we have to rescheduled. Patient's appointment has been rescheduled 1/26 at 2:55 pm for patient to call back to confirm.

## 2022-05-11 NOTE — Telephone Encounter (Signed)
I contacted patient via phone. I left message for patient to call back to confirm new date and time

## 2022-05-24 ENCOUNTER — Telehealth: Payer: Self-pay | Admitting: Certified Nurse Midwife

## 2022-05-24 NOTE — Telephone Encounter (Signed)
Left message for patient to call office back to r/s appt with AT on 05/27/22. Mychart message sent also

## 2022-05-24 NOTE — Telephone Encounter (Signed)
Patient rescheduled for 2/9 with AT

## 2022-05-25 ENCOUNTER — Ambulatory Visit: Payer: Medicaid Other | Admitting: Certified Nurse Midwife

## 2022-05-27 ENCOUNTER — Ambulatory Visit: Payer: Self-pay | Admitting: Certified Nurse Midwife

## 2022-06-10 ENCOUNTER — Ambulatory Visit: Payer: Self-pay | Admitting: Certified Nurse Midwife

## 2022-06-30 ENCOUNTER — Encounter: Payer: Self-pay | Admitting: Certified Nurse Midwife

## 2022-06-30 ENCOUNTER — Ambulatory Visit (INDEPENDENT_AMBULATORY_CARE_PROVIDER_SITE_OTHER): Payer: Medicaid Other | Admitting: Certified Nurse Midwife

## 2022-06-30 ENCOUNTER — Other Ambulatory Visit (HOSPITAL_COMMUNITY)
Admission: RE | Admit: 2022-06-30 | Discharge: 2022-06-30 | Disposition: A | Discharge status: Home / Self Care | Payer: Medicaid Other | Source: Ambulatory Visit | Attending: Certified Nurse Midwife | Admitting: Certified Nurse Midwife

## 2022-06-30 VITALS — BP 112/70 | HR 74 | Wt 193.5 lb

## 2022-06-30 DIAGNOSIS — Z8742 Personal history of other diseases of the female genital tract: Secondary | ICD-10-CM | POA: Insufficient documentation

## 2022-06-30 DIAGNOSIS — Z124 Encounter for screening for malignant neoplasm of cervix: Secondary | ICD-10-CM | POA: Insufficient documentation

## 2022-06-30 DIAGNOSIS — Z01419 Encounter for gynecological examination (general) (routine) without abnormal findings: Secondary | ICD-10-CM

## 2022-06-30 NOTE — Patient Instructions (Signed)

## 2022-06-30 NOTE — Progress Notes (Signed)
GYNECOLOGY ANNUAL PREVENTATIVE CARE ENCOUNTER NOTE  History:     Alicia Stephens is a 32 y.o. [phone] female here for a routine annual gynecologic exam.  Current complaints: none.   Denies abnormal vaginal bleeding, discharge, pelvic pain, problems with intercourse or other gynecologic concerns.     Social Relationship: married  Living: spouse and children Work: stay at home mom  Exercise: yoga, walking, stretching Smoke/Alcohol/drug use: denies use   Gynecologic History No LMP recorded (within years). Contraception: condoms Last Pap: 07/14/2021. Results were: ASCUS  with + HPV/high risk 16 Colposcopy: 08/12/2021 Last mammogram: n/a .   Obstetric History OB History  Gravida Para Term Preterm AB Living  '2 2 2     2  '$ SAB IAB Ectopic Multiple Live Births        0 2    # Outcome Date GA Lbr Len/2nd Weight Sex Delivery Anes PTL Lv  2 Term 01/11/22 [redacted]w[redacted]d/ 00:22 8 lb 14.5 oz (4.04 kg) F Vag-Spont EPI  LIV  1 Term 08/27/19 [id number] lb 9.9 oz (3.91 kg) F Vag-Spont EPI  LIV    Past Medical History:  Diagnosis Date   Breast mass     No past surgical history on file.  Current Outpatient Medications on File Prior to Visit  Medication Sig Dispense Refill   Prenat-Fe Poly-Methfol-FA-DHA (VITAFOL FE+) 90-0.6-0.4-200 MG CAPS Take 1 tablet by mouth daily. 30 capsule 9   No current facility-administered medications on file prior to visit.    No Known Allergies  Social History:  reports that she has never smoked. She has never used smokeless tobacco. She reports that she does not currently use alcohol. She reports that she does not currently use drugs after having used the following drugs: Marijuana.  Family History  Problem Relation Age of Onset   Breast cancer Paternal Grandmother    Diabetes Other     The following portions of the patient's history were reviewed and updated as appropriate: allergies, current medications, past family history, past medical  history, past social history, past surgical history and problem list.  Review of Systems Pertinent items noted in HPI and remainder of comprehensive ROS otherwise negative.  Physical Exam:  BP 112/70   Pulse 74   Wt 193 lb 8 oz (87.8 kg)   LMP  (Within Years) Comment: 1.5 patient reports  Breastfeeding Yes   BMI 28.57 kg/m  CONSTITUTIONAL: Well-developed, well-nourished female in no acute distress.  HENT:  Normocephalic, atraumatic, External right and left ear normal. Oropharynx is clear and moist EYES: Conjunctivae and EOM are normal. Pupils are equal, round, and reactive to light. No scleral icterus.  NECK: Normal range of motion, supple, no masses.  Normal thyroid.  SKIN: Skin is warm and dry. No rash noted. Not diaphoretic. No erythema. No pallor. MUSCULOSKELETAL: Normal range of motion. No tenderness.  No cyanosis, clubbing, or edema.  2+ distal pulses. NEUROLOGIC: Alert and oriented to person, place, and time. Normal reflexes, muscle tone coordination.  PSYCHIATRIC: Normal mood and affect. Normal behavior. Normal judgment and thought content. CARDIOVASCULAR: Normal heart rate noted, regular rhythm RESPIRATORY: Clear to auscultation bilaterally. Effort and breath sounds normal, no problems with respiration noted. BREASTS: declines, pt is lactating  ABDOMEN: Soft, no distention noted.  No tenderness, rebound or guarding.  PELVIC: Normal appearing external genitalia and urethral meatus; normal appearing vaginal mucosa and cervix.  No abnormal discharge noted.  Pap smear obtained.  Normal uterine size,  no other palpable masses, no uterine or adnexal tenderness.  .   Assessment and Plan:  Annual GYN exam   Pap: Will follow up results of pap smear and manage accordingly. Mammogram : n/a  Labs:declines  Refills:  undecided , condoms Referral: none Discussed pill/patch/ring. She is undecided at this time.  Routine preventative health maintenance measures emphasized. Please refer to  After Visit Summary for other counseling recommendations.      Philip Aspen, [facility]  [facility]

## 2022-07-11 ENCOUNTER — Encounter: Payer: Self-pay | Admitting: Certified Nurse Midwife

## 2022-07-11 LAB — CYTOLOGY - PAP
Comment: NEGATIVE
Comment: NEGATIVE
Comment: NEGATIVE
Diagnosis: NEGATIVE
HPV 16: POSITIVE — AB
HPV 18 / 45: NEGATIVE
High risk HPV: POSITIVE — AB

## 2022-07-20 ENCOUNTER — Telehealth: Payer: Self-pay | Admitting: Obstetrics and Gynecology

## 2022-07-20 NOTE — Telephone Encounter (Signed)
Pt is scheduled for a colpo with Dr. Amalia Hailey on April 4th.  She is in the self pay workqueue bc we don't have insurance on file for her.  Calling to inquire about insurance coverage.  Left message for pt to call BACK.

## 2022-07-22 NOTE — Telephone Encounter (Signed)
I contacted the patient via phone. I spoke with her about if we are filing insurance for the visit. The patient states she is working on Print production planner for the colposcopy. I advised the patient to let us know if she will not be filing so we could refer her to the [place] program to see if she qualifies they would be able to cover the visits and follow up care. The patient said she would call back. The patient is aware the appointment would have to be rescheduled in the event she would be referred to the [facility] program.

## 2022-07-27 NOTE — Telephone Encounter (Signed)
Pt's chart is now showing that she has [place] [employer] as her insurance.

## 2022-08-04 ENCOUNTER — Other Ambulatory Visit (HOSPITAL_COMMUNITY)
Admission: RE | Admit: 2022-08-04 | Discharge: 2022-08-04 | Disposition: A | Discharge status: Home / Self Care | Payer: Medicaid Other | Source: Ambulatory Visit | Attending: Obstetrics and Gynecology | Admitting: Obstetrics and Gynecology

## 2022-08-04 ENCOUNTER — Encounter: Payer: Self-pay | Admitting: Obstetrics and Gynecology

## 2022-08-04 ENCOUNTER — Ambulatory Visit (INDEPENDENT_AMBULATORY_CARE_PROVIDER_SITE_OTHER): Payer: Medicaid Other | Admitting: Obstetrics and Gynecology

## 2022-08-04 VITALS — BP 111/70 | HR 75 | Ht 69.0 in | Wt 182.6 lb

## 2022-08-04 DIAGNOSIS — Z3202 Encounter for pregnancy test, result negative: Secondary | ICD-10-CM | POA: Diagnosis not present

## 2022-08-04 DIAGNOSIS — R87810 Cervical high risk human papillomavirus (HPV) DNA test positive: Secondary | ICD-10-CM | POA: Insufficient documentation

## 2022-08-04 DIAGNOSIS — N888 Other specified noninflammatory disorders of cervix uteri: Secondary | ICD-10-CM | POA: Diagnosis not present

## 2022-08-04 LAB — POCT URINE PREGNANCY: Preg Test, Ur: NEGATIVE

## 2022-08-04 NOTE — Progress Notes (Signed)
Referring Provider:  Deneise Lever AOB  HPI:  Alicia Stephens is a 32 y.o.  [phone]  who presents today for evaluation and management of abnormal cervical cytology.    Patient had an ASCUS Pap smear with positive type 16.  She underwent colposcopy during her pregnancy without biopsies.  Her pregnancy ended in vaginal delivery 6 months ago.  Her most recent Pap shows negative cytology but remains positive for type 16.   Dysplasia History: Negative cytology     HPV: Positive type 16  ROS:  Pertinent items noted in HPI and remainder of comprehensive ROS otherwise negative.  OB History  Gravida Para Term Preterm AB Living  2 2 2     2   SAB IAB Ectopic Multiple Live Births        0 2    # Outcome Date GA Lbr Len/2nd Weight Sex Delivery Anes PTL Lv  2 Term 01/11/22 [redacted]w[redacted]d / 00:22 8 lb 14.5 oz (4.04 kg) F Vag-Spont EPI  LIV  1 Term 08/27/19 [redacted]w[redacted]d  8 lb 9.9 oz (3.91 kg) F Vag-Spont EPI  LIV    Past Medical History:  Diagnosis Date   Breast mass     History reviewed. No pertinent surgical history.  SOCIAL HISTORY:  Social History   Substance and Sexual Activity  Alcohol Use Not Currently    Social History   Substance and Sexual Activity  Drug Use Not Currently   Types: Marijuana     Family History  Problem Relation Age of Onset   Breast cancer Paternal Grandmother    Diabetes Other     ALLERGIES:  Patient has no known allergies.  She has a current medication list which includes the following prescription(s): vitafol fe+.  Physical Exam: -Vitals:  BP 111/70   Pulse 75   Ht 5\' 9"  (1.753 m)   Wt 182 lb 9.6 oz (82.8 kg)   LMP 07/25/2022   BMI 26.97 kg/m   PROCEDURE: Colposcopy performed with 4% acetic acid after verbal consent obtained             No external acetowhite changes!!!              -Aceto-white Lesions Location(s): See above                           -ECC indicated and performed: Yes.       -Biopsy sites made hemostatic with pressure and Monsel's  solution   -Satisfactory colposcopy: Yes.     T-zone visualized   -Evidence of Invasive cervical CA :  NO  ASSESSMENT:  Alicia Stephens is a 32 y.o. [phone] here for  1. 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  PLAN: 1.  I discussed the grading system of pap smears and HPV high risk viral types.  We will discuss management after colpo results return.  No orders of the defined types were placed in this encounter.          F/U  Return in about 2 weeks (around 08/18/2022) for Colpo f/u.  Jeannie Fend ,MD 08/04/2022,3:05 PM

## 2022-08-04 NOTE — Progress Notes (Signed)
Patient presents today for colposcopy. She recently had an abnormal pap smear resulting in HPV+ type 16, history of this. Patient states she is anxious about the procedure.

## 2022-08-08 LAB — SURGICAL PATHOLOGY

## 2022-08-09 ENCOUNTER — Encounter: Payer: Self-pay | Admitting: Obstetrics and Gynecology

## 2022-08-18 ENCOUNTER — Encounter: Payer: Self-pay | Admitting: Obstetrics and Gynecology

## 2022-08-18 ENCOUNTER — Telehealth (INDEPENDENT_AMBULATORY_CARE_PROVIDER_SITE_OTHER): Payer: Medicaid Other | Admitting: Obstetrics and Gynecology

## 2022-08-18 DIAGNOSIS — N72 Inflammatory disease of cervix uteri: Secondary | ICD-10-CM

## 2022-08-18 DIAGNOSIS — B977 Papillomavirus as the cause of diseases classified elsewhere: Secondary | ICD-10-CM | POA: Diagnosis not present

## 2022-08-18 NOTE — Progress Notes (Signed)
Virtual Visit via Video Note  I connected with Alicia Stephens on 08/18/22 at  7:35 AM EDT by video and verified that I was speaking with the correct person using two identifiers.    Ms. Alicia Stephens is a 32 y.o. [id number] who LMP was Patient's last menstrual period was 07/25/2022. I discussed the limitations, risks, security and privacy concerns of performing an evaluation and management service by video and the availability of in person appointments. I also discussed with the patient that there may be a patient responsible charge related to this service. The patient expressed understanding and agreed to proceed.  Location of patient: [place]  Patient gave explicit verbal consent for video visit:  YES  Location of provider:  [facility] office  Persons other than physician and patient involved in provider conference:  None   Subjective:   History of Present Illness:    Patient has a history of type 16 HPV.  Her cytology has consistently been negative.  She underwent colposcopy which showed no evidence of a visible lesion on the cervix.  An ECC was performed.  Hx: The following portions of the patient's history were reviewed and updated as appropriate:             She  has a past medical history of Breast mass. She does not have any pertinent problems on file. She  has no past surgical history on file. Her family history includes Breast cancer in her paternal grandmother; Diabetes in an other family member. She  reports that she has never smoked. She has never used smokeless tobacco. She reports that she does not currently use alcohol. She reports that she does not currently use drugs after having used the following drugs: Marijuana. She has a current medication list which includes the following prescription(s): vitafol fe+. She has No Known Allergies.       Review of Systems:  Review of Systems  Constitutional: Denied constitutional symptoms, night sweats, recent illness,  fatigue, fever, insomnia and weight loss.  Eyes: Denied eye symptoms, eye pain, photophobia, vision change and visual disturbance.  Ears/Nose/Throat/Neck: Denied ear, nose, throat or neck symptoms, hearing loss, nasal discharge, sinus congestion and sore throat.  Cardiovascular: Denied cardiovascular symptoms, arrhythmia, chest pain/pressure, edema, exercise intolerance, orthopnea and palpitations.  Respiratory: Denied pulmonary symptoms, asthma, pleuritic pain, productive sputum, cough, dyspnea and wheezing.  Gastrointestinal: Denied, gastro-esophageal reflux, melena, nausea and vomiting.  Genitourinary: Denied genitourinary symptoms including symptomatic vaginal discharge, pelvic relaxation issues, and urinary complaints.  Musculoskeletal: Denied musculoskeletal symptoms, stiffness, swelling, muscle weakness and myalgia.  Dermatologic: Denied dermatology symptoms, rash and scar.  Neurologic: Denied neurology symptoms, dizziness, headache, neck pain and syncope.  Psychiatric: Denied psychiatric symptoms, anxiety and depression.  Endocrine: Denied endocrine symptoms including hot flashes and night sweats.   Meds:   Current Outpatient Medications on File Prior to Visit  Medication Sig Dispense Refill   Prenat-Fe Poly-Methfol-FA-DHA (VITAFOL FE+) 90-0.6-0.4-200 MG CAPS Take 1 tablet by mouth daily. 30 capsule 9   No current facility-administered medications on file prior to visit.    Assessment:    [id number] Patient Active Problem List   Diagnosis Date Noted   History of abnormal cervical Pap smear 06/30/2022     1. High risk human papilloma virus (HPV) infection of cervix     ECC is negative.  Colposcopy shows no ectocervical lesion.  Both of these findings are consistent with her most recent Pap smear showing no cytologic abnormality.  Plan:  1.  Based on the fact that she has no cytologic abnormality but is positive for HPV I have recommended a follow-up Pap smear in 1  year.  I have stressed the importance of this Pap smear.  Viral type 16 was discussed.  All questions answered  No orders of the defined types were placed in this encounter.   No orders of the defined types were placed in this encounter.     F/U  No follow-ups on file. I spent 21 minutes involved in the care of this patient preparing to see the patient by obtaining and reviewing her medical history (including labs, imaging tests and prior procedures), documenting clinical information in the electronic health record (EHR), counseling and coordinating care plans, writing and sending prescriptions, ordering tests or procedures and in direct communicating with the patient and medical staff discussing pertinent items from her history and physical exam.   Elonda Husky, M.D. 08/18/2022 8:04 AM

## 2023-10-16 ENCOUNTER — Other Ambulatory Visit (HOSPITAL_COMMUNITY)
Admission: RE | Admit: 2023-10-16 | Discharge: 2023-10-16 | Disposition: A | Discharge status: Home / Self Care | Source: Ambulatory Visit | Attending: Certified Nurse Midwife | Admitting: Certified Nurse Midwife

## 2023-10-16 ENCOUNTER — Encounter: Payer: Self-pay | Admitting: Certified Nurse Midwife

## 2023-10-16 ENCOUNTER — Ambulatory Visit (INDEPENDENT_AMBULATORY_CARE_PROVIDER_SITE_OTHER): Admitting: Certified Nurse Midwife

## 2023-10-16 VITALS — BP 109/73 | HR 71 | Ht 69.0 in | Wt 180.6 lb

## 2023-10-16 DIAGNOSIS — Z124 Encounter for screening for malignant neoplasm of cervix: Secondary | ICD-10-CM

## 2023-10-16 DIAGNOSIS — N939 Abnormal uterine and vaginal bleeding, unspecified: Secondary | ICD-10-CM

## 2023-10-16 DIAGNOSIS — Z01419 Encounter for gynecological examination (general) (routine) without abnormal findings: Secondary | ICD-10-CM

## 2023-10-16 DIAGNOSIS — Z13220 Encounter for screening for lipoid disorders: Secondary | ICD-10-CM

## 2023-10-16 DIAGNOSIS — R635 Abnormal weight gain: Secondary | ICD-10-CM

## 2023-10-16 DIAGNOSIS — Z1329 Encounter for screening for other suspected endocrine disorder: Secondary | ICD-10-CM

## 2023-10-16 NOTE — Progress Notes (Signed)
 GYNECOLOGY ANNUAL PREVENTATIVE CARE ENCOUNTER NOTE  History:     Alicia Stephens is a 33 y.o. [phone] female here for a routine annual gynecologic exam.  Current complaints: discuss change in menstrual cycle and contraception options.   Denies abnormal vaginal bleeding, discharge, pelvic pain, problems with intercourse or other gynecologic concerns.     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 Contraception: none Last Pap: 06/30/2022. Results were: normal with negative HPV   Obstetric History OB History  Gravida Para Term Preterm AB Living  2 2 2   2   SAB IAB Ectopic Multiple Live Births     0 2    # Outcome Date GA Lbr Len/2nd Weight Sex Type Anes PTL Lv  2 Term 01/11/22 [redacted]w[redacted]d / 00:22 8 lb 14.5 oz (4.04 kg) F Vag-Spont EPI  LIV  1 Term 08/27/19 [redacted]w[redacted]d  8 lb 9.9 oz (3.91 kg) F Vag-Spont EPI  LIV    Past Medical History:  Diagnosis Date   Breast mass     History reviewed. No pertinent surgical history.  Current Outpatient Medications on File Prior to Visit  Medication Sig Dispense Refill   Prenat-Fe Poly-Methfol-FA-DHA (VITAFOL  FE+) 90-0.6-0.4-200 MG CAPS Take 1 tablet by mouth daily. 30 capsule 9   No current facility-administered medications on file prior to visit.    No Known Allergies  Social History:  reports that she has never smoked. She has never used smokeless tobacco. She reports that she does not currently use alcohol. She reports that she does not currently use drugs after having used the following drugs: Marijuana.  Family History  Problem Relation Age of Onset   Breast cancer Paternal Grandmother    Diabetes Other     The following portions of the patient's history were reviewed and updated as appropriate: allergies, current  medications, past family history, past medical history, past social history, past surgical history and problem list.  Review of Systems Pertinent items noted in HPI and remainder of comprehensive ROS otherwise negative.  Physical Exam:  BP 109/73   Pulse 71   Ht 5' 9 (1.753 m)   Wt 180 lb 9.6 oz (81.9 kg)   LMP 10/04/2023   Breastfeeding No   BMI 26.67 kg/m  CONSTITUTIONAL: Well-developed, well-nourished female in no acute distress.  HENT:  Normocephalic, atraumatic, External right and left ear normal. Oropharynx is clear and moist EYES: Conjunctivae and EOM are normal. Pupils are equal, round, and reactive to light. No scleral icterus.  NECK: Normal range of motion, supple, no masses.  Normal thyroid.  SKIN: Skin is warm and dry. No rash noted. Not diaphoretic. No erythema. No pallor. MUSCULOSKELETAL: Normal range of motion. No tenderness.  No cyanosis, clubbing, or edema.  2+ distal pulses. NEUROLOGIC: Alert and oriented to person, place, and time. Normal reflexes, muscle tone coordination.  PSYCHIATRIC: Normal mood and affect. Normal behavior. Normal judgment and thought content. CARDIOVASCULAR: Normal heart rate noted, regular rhythm RESPIRATORY: Clear to auscultation bilaterally. Effort and breath sounds normal, no problems with respiration noted. BREASTS: Symmetric in size. No masses, tenderness, skin changes, nipple drainage, or lymphadenopathy bilaterally.  ABDOMEN: Soft, no distention noted.  No tenderness, rebound or guarding.  PELVIC: Normal appearing external genitalia and urethral meatus; normal appearing vaginal mucosa and cervix.  No abnormal discharge noted.  Pap smear obtained.  Normal uterine size, no other palpable masses, no uterine or adnexal tenderness.  .   Assessment and Plan:  Annual Well Women GYN Exam   Pap: Will follow up results of pap smear and manage accordingly. Mammogram : n/a  Labs: ordered will do later time Refills:none Referral: none Reviewed  all forms of birth control options available including abstinence; fertility period awareness methods; over the counter/barrier methods; hormonal contraceptive medication including pill, patch, ring, injection,contraceptive implant; hormonal and nonhormonal IUDs; . Risks and benefits reviewed.  Questions were answered.  Information was given to patient to review.  Routine preventative health maintenance measures emphasized. Please refer to After Visit Summary for other counseling recommendations.      Alise Appl, CNM [facility]  [facility],  [facility]

## 2023-10-16 NOTE — Patient Instructions (Signed)

## 2023-10-17 LAB — CERVICOVAGINAL ANCILLARY ONLY
Bacterial Vaginitis (gardnerella): POSITIVE — AB
Candida Glabrata: NEGATIVE
Candida Vaginitis: NEGATIVE
Chlamydia: NEGATIVE
Comment: NEGATIVE
Comment: NEGATIVE
Comment: NEGATIVE
Comment: NEGATIVE
Comment: NEGATIVE
Comment: NORMAL
Neisseria Gonorrhea: NEGATIVE
Trichomonas: NEGATIVE

## 2023-10-18 ENCOUNTER — Other Ambulatory Visit: Payer: Self-pay | Admitting: Certified Nurse Midwife

## 2023-10-18 ENCOUNTER — Encounter: Payer: Self-pay | Admitting: Certified Nurse Midwife

## 2023-10-18 LAB — CYTOLOGY - PAP
Comment: NEGATIVE
Comment: NEGATIVE
Comment: NEGATIVE
Diagnosis: HIGH — AB
HPV 16: POSITIVE — AB
HPV 18 / 45: NEGATIVE
High risk HPV: POSITIVE — AB

## 2023-10-18 MED ORDER — METRONIDAZOLE 500 MG PO TABS: 500.0000 mg | ORAL_TABLET | Freq: Two times a day (BID) | ORAL | 0 refills | Status: AC

## 2023-12-22 ENCOUNTER — Telehealth: Payer: Self-pay

## 2023-12-22 NOTE — Telephone Encounter (Signed)
 Patient called she would like to go ahead and start HPV vaccines. She was last seen on 10/16/23 for her annual exam with you. Need order or permission from you to start vaccines. Last pap was 10/16/23: HSIL, positive for high risk HPV, positive for HPV 16.   Please advise.

## 2023-12-25 ENCOUNTER — Other Ambulatory Visit: Payer: Self-pay

## 2023-12-25 DIAGNOSIS — Z23 Encounter for immunization: Secondary | ICD-10-CM

## 2024-01-08 ENCOUNTER — Ambulatory Visit

## 2024-01-15 ENCOUNTER — Ambulatory Visit (INDEPENDENT_AMBULATORY_CARE_PROVIDER_SITE_OTHER)

## 2024-01-15 VITALS — BP 107/68 | HR 71 | Ht 69.0 in | Wt 177.2 lb

## 2024-01-15 DIAGNOSIS — Z23 Encounter for immunization: Secondary | ICD-10-CM

## 2024-01-15 NOTE — Progress Notes (Signed)
    NURSE VISIT NOTE  Subjective:    Patient ID: Alicia Stephens, female    DOB: 1990/08/21, 33 y.o.   MRN: [id number]  HPI  Patient is a 33 y.o. G90P2002 female Single Caucasian female who presents for her first Gardasil injection. Order to administer given by Zelda Hummer, CNM on 10/16/2023.   Objective:    BP 107/68   Pulse 71   Ht 5' 9 (1.753 m) Comment: patient reports  Wt 177 lb 3.2 oz (80.4 kg)   LMP  (Within Weeks)   BMI 26.17 kg/m   33 y.o. LMP:  within weeks  Contraception:  None Given by: Rollo Maxin, CMA Site:  left deltoid  Lab Review  No results found for any visits on 01/15/24.    Assessment:   1. Need for HPV vaccine      Plan:   Patient will return in 2 months for second injection.    Rollo JINNY Maxin, CMA

## 2024-01-15 NOTE — Patient Instructions (Signed)
 HPV (Human Papillomavirus) Vaccine: What You Need to Know Many vaccine information statements are available in Spanish and other languages. See PromoAge.com.br. 1. Why get vaccinated? HPV (human papillomavirus) vaccine can prevent infection with some types of human papillomavirus. HPV infections can cause certain types of cancers, including: cervical, vaginal, and vulvar cancers in women penile cancer in men anal cancers in both men and women cancers of tonsils, base of tongue, and back of throat (oropharyngeal cancer) in both men and women HPV infections can also cause anogenital warts. HPV vaccine can prevent over 90% of cancers caused by HPV. HPV is spread through intimate skin-to-skin or sexual contact. HPV infections are so common that nearly all people will get at least one type of HPV at some time in their lives. Most HPV infections go away on their own within 2 years. But sometimes HPV infections will last longer and can cause cancers later in life. 2. HPV vaccine HPV vaccine is routinely recommended for adolescents at 79 or 33 years of age to ensure they are protected before they are exposed to the virus. HPV vaccine may be given beginning at age 25 years and vaccination is recommended for everyone through 33 years of age. HPV vaccine may be given to adults 27 through 33 years of age, based on discussions between the patient and health care provider. Most children who get the first dose before 66 years of age need 2 doses of HPV vaccine. People who get the first dose at or after 62 years of age and younger people with certain immunocompromising conditions need 3 doses. Your health care provider can give you more information. HPV vaccine may be given at the same time as other vaccines. 3. Talk with your health care provider Tell your vaccination provider if the person getting the vaccine: Has had an allergic reaction after a previous dose of HPV vaccine, or has any severe,  life-threatening allergies Is pregnant--HPV vaccine is not recommended until after pregnancy In some cases, your health care provider may decide to postpone HPV vaccination until a future visit. People with minor illnesses, such as a cold, may be vaccinated. People who are moderately or severely ill should usually wait until they recover before getting HPV vaccine. Your health care provider can give you more information. 4. Risks of a vaccine reaction Soreness, redness, or swelling where the shot is given can happen after HPV vaccination. Fever or headache can happen after HPV vaccination. People sometimes faint after medical procedures, including vaccination. Tell your provider if you feel dizzy or have vision changes or ringing in the ears. As with any medicine, there is a very remote chance of a vaccine causing a severe allergic reaction, other serious injury, or death. 5. What if there is a serious problem? An allergic reaction could occur after the vaccinated person leaves the clinic. If you see signs of a severe allergic reaction (hives, swelling of the face and throat, difficulty breathing, a fast heartbeat, dizziness, or weakness), call 9-1-1 and get the person to the nearest hospital. For other signs that concern you, call your health care provider. Adverse reactions should be reported to the Vaccine Adverse Event Reporting System (VAERS). Your health care provider will usually file this report, or you can do it yourself. Visit the VAERS website at www.vaers.LAgents.no or call 256-510-8455. VAERS is only for reporting reactions, and VAERS staff members do not give medical advice. 6. The National Vaccine Injury Compensation Program The [employer] Vaccine Injury Compensation Program (VICP) is a  federal program that was created to compensate people who may have been injured by certain vaccines. Claims regarding alleged injury or death due to vaccination have a time limit for filing, which may be as  short as two years. Visit the VICP website at SpiritualWord.at or call 931-622-2373 to learn about the program and about filing a claim. 7. How can I learn more? Ask your health care provider. Call your local or state health department. Visit the website of the Food and Drug Administration (FDA) for vaccine package inserts and additional information at FinderList.no. Contact the Centers for Disease Control and Prevention (CDC): Call 737-738-9397 (1-800-CDC-INFO) or Visit CDC's website at PicCapture.uy. Source: CDC Vaccine Information Statement HPV Vaccine (12/06/2019) This same material is available at FootballExhibition.com.br for no charge. This information is not intended to replace advice given to you by your health care provider. Make sure you discuss any questions you have with your health care provider. Document Revised: 08/03/2022 Document Reviewed: 05/09/2022 Elsevier Patient Education  2024 [employer].

## 2024-01-22 ENCOUNTER — Telehealth: Payer: Self-pay | Admitting: General Practice

## 2024-01-22 NOTE — Telephone Encounter (Signed)
 Please advise if this is okay.  opied from CRM #[id number]. Topic: Appointments - Scheduling Inquiry for Clinic >> Jan 22, 2024 12:52 PM Dedra B wrote: Reason for CRM: Pt was okayed to establish care with Comer Gaskins, but I was unable to schedule. Pls call pt to schedule.

## 2024-01-22 NOTE — Telephone Encounter (Signed)
Yes, okay to establish care with me

## 2024-02-22 ENCOUNTER — Encounter: Payer: Self-pay | Admitting: Primary Care

## 2024-02-22 ENCOUNTER — Ambulatory Visit: Admitting: Primary Care

## 2024-02-22 VITALS — BP 118/70 | HR 74 | Temp 97.5°F | Ht 69.0 in | Wt 174.0 lb

## 2024-02-22 DIAGNOSIS — F4323 Adjustment disorder with mixed anxiety and depressed mood: Secondary | ICD-10-CM

## 2024-02-22 MED ORDER — SERTRALINE HCL 25 MG PO TABS: 25.0000 mg | ORAL_TABLET | Freq: Every day | ORAL | 0 refills | Status: DC

## 2024-02-22 NOTE — Assessment & Plan Note (Signed)
 Uncontrolled.  Discussed options for treatment.  She will proceed with therapy as planned.  Start sertraline (Zoloft) 25 mg tablets for anxiety and depression.  Take 1/2 tablet by mouth daily for 1 week, then increase to 1 full tablet daily thereafter. We discussed potential side effects.   Will plan to follow-up in 4-6 weeks for reevaluation.

## 2024-02-22 NOTE — Patient Instructions (Signed)
 Start sertraline (Zoloft) tablets for anxiety and depression.  Take 1/2 tablet by mouth daily for 1 week, then increase to 1 full tablet daily thereafter.  Please schedule a follow up visit for 6 weeks for follow up of anxiety/depression.  It was a pleasure to meet you today! Please don't hesitate to contact me with any questions. Welcome to [facility]!

## 2024-02-22 NOTE — Progress Notes (Signed)
 Subjective:    Patient ID: Alicia Stephens, female    DOB: 09-Aug-1990, 33 y.o.   MRN: [id number]  Alicia Stephens is a very pleasant 33 y.o. female who presents today who presents today to establish care and discuss the problems mentioned below. Will obtain/review records.  Prior history of anxiety which has been well controlled over the years. Over the last few months she's lost her grandmother, she is going through a break up. Five years ago she lost her father. Symptoms now include feeling down, little interest in doing things, fatigue, worrying. Historically, she's treated her symptoms naturally. Symptoms are now interfering with her day to day including home and work.   She will start counseling soon. She is interested in medication treatment.      02/22/2024   11:42 AM  GAD 7 : Generalized Anxiety Score  Nervous, Anxious, on Edge 0  Control/stop worrying 1  Worry too much - different things 2  Trouble relaxing 0  Restless 0  Easily annoyed or irritable 1  Afraid - awful might happen 0  Total GAD 7 Score 4  Anxiety Difficulty Somewhat difficult       02/22/2024   11:39 AM 10/09/2019    2:20 PM 09/10/2019   11:24 AM  PHQ9 SCORE ONLY  PHQ-9 Total Score 5 0  0      Data saved with a previous flowsheet row definition     BP Readings from Last 3 Encounters:  02/22/24 118/70  01/15/24 107/68  10/16/23 109/73       Review of Systems  Constitutional:  Positive for fatigue.  Respiratory:  Negative for shortness of breath.   Cardiovascular:  Negative for chest pain.  Psychiatric/Behavioral:  Negative for suicidal ideas. The patient is nervous/anxious.          Past Medical History:  Diagnosis Date   Breast mass     Social History   Socioeconomic History   Marital status: Single    Spouse name: Not on file   Number of children: Not on file   Years of education: Not on file   Highest education level: Not on file  Occupational History   Not on file   Tobacco Use   Smoking status: Never   Smokeless tobacco: Never  Vaping Use   Vaping status: Never Used  Substance and Sexual Activity   Alcohol use: Not Currently   Drug use: Not Currently    Types: Marijuana   Sexual activity: Yes    Birth control/protection: None  Other Topics Concern   Not on file  Social History Narrative   Not on file   Social Drivers of Health   Financial Resource Strain: Not on file  Food Insecurity: Not on file  Transportation Needs: Not on file  Physical Activity: Not on file  Stress: Not on file  Social Connections: Not on file  Intimate Partner Violence: Not on file    History reviewed. No pertinent surgical history.  Family History  Problem Relation Age of Onset   Alcohol abuse Father    Breast cancer Paternal Grandmother    Alzheimer's disease Paternal Grandmother    Diabetes Other     No Known Allergies  Current Outpatient Medications on File Prior to Visit  Medication Sig Dispense Refill   ASHWAGANDHA PO Take by mouth.     Multiple Vitamins-Minerals (WOMENS MULTIVITAMIN PO) Take by mouth.     Prenat-Fe Poly-Methfol-FA-DHA (VITAFOL  FE+) 90-0.6-0.4-200 MG CAPS Take 1 tablet by  mouth daily. (Patient not taking: Reported on 02/22/2024) 30 capsule 9   No current facility-administered medications on file prior to visit.    BP 118/70   Pulse 74   Temp (!) 97.5 F (36.4 C) (Temporal)   Ht 5' 9 (1.753 m)   Wt 174 lb (78.9 kg)   LMP 02/09/2024   SpO2 98%   BMI 25.70 kg/m  Objective:   Physical Exam Cardiovascular:     Rate and Rhythm: Normal rate and regular rhythm.  Pulmonary:     Effort: Pulmonary effort is normal.     Breath sounds: Normal breath sounds.  Musculoskeletal:     Cervical back: Neck supple.  Skin:    General: Skin is warm and dry.  Neurological:     Mental Status: She is alert and oriented to person, place, and time.  Psychiatric:        Mood and Affect: Mood normal.     Comments: Slightly tearful.       Physical Exam        Assessment & Plan:  Adjustment reaction with anxiety and depression Assessment & Plan: Uncontrolled.  Discussed options for treatment.  She will proceed with therapy as planned.  Start sertraline (Zoloft) 25 mg tablets for anxiety and depression.  Take 1/2 tablet by mouth daily for 1 week, then increase to 1 full tablet daily thereafter. We discussed potential side effects.   Will plan to follow-up in 4-6 weeks for reevaluation.  Orders: -     Sertraline HCl; Take 1 tablet (25 mg total) by mouth daily. for anxiety and depression.  Dispense: 90 tablet; Refill: 0    Assessment and Plan Assessment & Plan         Comer MARLA Gaskins, NP    History of Present Illness

## 2024-03-15 ENCOUNTER — Ambulatory Visit (INDEPENDENT_AMBULATORY_CARE_PROVIDER_SITE_OTHER)

## 2024-03-15 VITALS — BP 115/58 | HR 75 | Resp 16 | Ht 69.0 in | Wt 173.0 lb

## 2024-03-15 DIAGNOSIS — Z23 Encounter for immunization: Secondary | ICD-10-CM | POA: Diagnosis not present

## 2024-03-15 NOTE — Progress Notes (Signed)
    NURSE VISIT NOTE  Subjective:    Patient ID: Alicia Stephens, female    DOB: December 17, 1990, 33 y.o.   MRN: [id number]  HPI  Patient is a 33 y.o. G13P2002 female Single Caucasian female who presents for her second Gardasil injection. Order to administer given by Zelda Hummer, CNM on 01/15/2024.   Objective:    LMP 02/09/2024   33 y.o. LMP:  03/07/2024  Contraception:  None Given by: Camelia Fetters, CMA Site:  right deltoid  Lab Review  No results found for any visits on 03/15/24.    Assessment:   1. Need for HPV vaccine      Plan:   Patient will return in 4 months for third injection.      Camelia Fetters, CMA [facility]

## 2024-03-15 NOTE — Patient Instructions (Signed)
 HPV (Human Papillomavirus) Vaccine: What You Need to Know Many vaccine information statements are available in Spanish and other languages. See PromoAge.com.br. 1. Why get vaccinated? HPV (human papillomavirus) vaccine can prevent infection with some types of human papillomavirus. HPV infections can cause certain types of cancers, including: cervical, vaginal, and vulvar cancers in women penile cancer in men anal cancers in both men and women cancers of tonsils, base of tongue, and back of throat (oropharyngeal cancer) in both men and women HPV infections can also cause anogenital warts. HPV vaccine can prevent over 90% of cancers caused by HPV. HPV is spread through intimate skin-to-skin or sexual contact. HPV infections are so common that nearly all people will get at least one type of HPV at some time in their lives. Most HPV infections go away on their own within 2 years. But sometimes HPV infections will last longer and can cause cancers later in life. 2. HPV vaccine HPV vaccine is routinely recommended for adolescents at 79 or 33 years of age to ensure they are protected before they are exposed to the virus. HPV vaccine may be given beginning at age 25 years and vaccination is recommended for everyone through 33 years of age. HPV vaccine may be given to adults 27 through 33 years of age, based on discussions between the patient and health care provider. Most children who get the first dose before 66 years of age need 2 doses of HPV vaccine. People who get the first dose at or after 62 years of age and younger people with certain immunocompromising conditions need 3 doses. Your health care provider can give you more information. HPV vaccine may be given at the same time as other vaccines. 3. Talk with your health care provider Tell your vaccination provider if the person getting the vaccine: Has had an allergic reaction after a previous dose of HPV vaccine, or has any severe,  life-threatening allergies Is pregnant--HPV vaccine is not recommended until after pregnancy In some cases, your health care provider may decide to postpone HPV vaccination until a future visit. People with minor illnesses, such as a cold, may be vaccinated. People who are moderately or severely ill should usually wait until they recover before getting HPV vaccine. Your health care provider can give you more information. 4. Risks of a vaccine reaction Soreness, redness, or swelling where the shot is given can happen after HPV vaccination. Fever or headache can happen after HPV vaccination. People sometimes faint after medical procedures, including vaccination. Tell your provider if you feel dizzy or have vision changes or ringing in the ears. As with any medicine, there is a very remote chance of a vaccine causing a severe allergic reaction, other serious injury, or death. 5. What if there is a serious problem? An allergic reaction could occur after the vaccinated person leaves the clinic. If you see signs of a severe allergic reaction (hives, swelling of the face and throat, difficulty breathing, a fast heartbeat, dizziness, or weakness), call 9-1-1 and get the person to the nearest hospital. For other signs that concern you, call your health care provider. Adverse reactions should be reported to the Vaccine Adverse Event Reporting System (VAERS). Your health care provider will usually file this report, or you can do it yourself. Visit the VAERS website at www.vaers.LAgents.no or call 256-510-8455. VAERS is only for reporting reactions, and VAERS staff members do not give medical advice. 6. The National Vaccine Injury Compensation Program The [employer] Vaccine Injury Compensation Program (VICP) is a  federal program that was created to compensate people who may have been injured by certain vaccines. Claims regarding alleged injury or death due to vaccination have a time limit for filing, which may be as  short as two years. Visit the VICP website at SpiritualWord.at or call 931-622-2373 to learn about the program and about filing a claim. 7. How can I learn more? Ask your health care provider. Call your local or state health department. Visit the website of the Food and Drug Administration (FDA) for vaccine package inserts and additional information at FinderList.no. Contact the Centers for Disease Control and Prevention (CDC): Call 737-738-9397 (1-800-CDC-INFO) or Visit CDC's website at PicCapture.uy. Source: CDC Vaccine Information Statement HPV Vaccine (12/06/2019) This same material is available at FootballExhibition.com.br for no charge. This information is not intended to replace advice given to you by your health care provider. Make sure you discuss any questions you have with your health care provider. Document Revised: 08/03/2022 Document Reviewed: 05/09/2022 Elsevier Patient Education  2024 [employer].

## 2024-04-04 ENCOUNTER — Encounter: Payer: Self-pay | Admitting: Primary Care

## 2024-04-04 ENCOUNTER — Telehealth: Admitting: Primary Care

## 2024-04-04 VITALS — Temp 97.3°F | Ht 69.0 in | Wt 164.4 lb

## 2024-04-04 DIAGNOSIS — F4323 Adjustment disorder with mixed anxiety and depressed mood: Secondary | ICD-10-CM

## 2024-04-04 NOTE — Assessment & Plan Note (Signed)
 Improved!  Continue Zoloft  12.5 mg daily.  She will update if she decides to take a full tablet consistently.  Proceed with therapy as scheduled.

## 2024-04-04 NOTE — Progress Notes (Signed)
 Patient ID: Alicia Stephens, female    DOB: [birth year], 33 y.o.   MRN: [id number]  Virtual visit completed through [employer], a video enabled telemedicine application. Due to national recommendations of social distancing due to COVID-19, a virtual visit is felt to be most appropriate for this patient at this time. Reviewed limitations, risks, security and privacy concerns of performing a virtual visit and the availability of in person appointments. I also reviewed that there may be a patient responsible charge related to this service. The patient agreed to proceed.   Patient location: home Provider location: [place] at [facility], office Persons participating in this virtual visit: patient, provider   If any vitals were documented, they were collected by patient at home unless specified below.    Temp (!) 97.3 F (36.3 C)   Ht 5' 9 (1.753 m)   Wt 164 lb 6 oz (74.6 kg)   LMP 03/07/2024   BMI 24.27 kg/m    CC: Anxiety and Depression  Subjective:   HPI: Alicia Stephens is a 33 y.o. female presenting on 04/04/2024 for Medical Management of Chronic Issues (Visit for 6 wk mood f/u.)  She was last evaluated on 02/22/24 for establish care appointment to discuss anxiety and depression. Symptoms included feeling down/sad, fatigue, worrying, little interest in doing things. During this visit we initiated Zoloft  25 gm daily. She was to follow up with therapy as scheduled.    Since her last visit she is compliant to Zoloft  25 mg daily, but she's taking 12.5 mg daily. She's feeling better and finds the 12.5 mg dose to be effective. Positive effects include feeling more motivated to do things, is feeling less down/sad. She continues to worry, but this is more controlled. She did experience mild insomnia so she began taking her Zoloft  at night and this has improved.   She is scheduled to see her therapist in the early new year.      Relevant past medical, surgical, family and social  history reviewed and updated as indicated. Interim medical history since our last visit reviewed. Allergies and medications reviewed and updated. Outpatient Medications Prior to Visit  Medication Sig Dispense Refill   sertraline  (ZOLOFT ) 25 MG tablet Take 1 tablet (25 mg total) by mouth daily. for anxiety and depression. 90 tablet 0   Prenat-Fe Poly-Methfol-FA-DHA (VITAFOL  FE+) 90-0.6-0.4-200 MG CAPS Take 1 tablet by mouth daily. (Patient not taking: Reported on 02/22/2024) 30 capsule 9   No facility-administered medications prior to visit.     Per HPI unless specifically indicated in ROS section below Review of Systems  Gastrointestinal:  Negative for nausea.  Neurological:  Negative for headaches.  Psychiatric/Behavioral:  The patient is nervous/anxious.        See HPI   Objective:  Temp (!) 97.3 F (36.3 C)   Ht 5' 9 (1.753 m)   Wt 164 lb 6 oz (74.6 kg)   LMP 03/07/2024   BMI 24.27 kg/m   Wt Readings from Last 3 Encounters:  04/04/24 164 lb 6 oz (74.6 kg)  03/15/24 173 lb (78.5 kg)  02/22/24 174 lb (78.9 kg)       Physical exam: General: Alert and oriented x 3, no distress, does not appear sickly  Pulmonary: Speaks in complete sentences without increased work of breathing, no cough during visit.  Psychiatric: Normal mood, thought content, and behavior.     Results for orders placed or performed in visit on 10/16/23  Cytology - PAP   Collection  Time: 10/16/23  9:05 AM  Result Value Ref Range   High risk HPV Positive (A)    HPV 16 Positive (A)    HPV 18 / 45 Negative    Adequacy      Satisfactory for evaluation; transformation zone component PRESENT.   Diagnosis (A)     - High grade squamous intraepithelial lesion (HSIL)   Microorganisms Shift in flora suggestive of bacterial vaginosis    Comment Normal Reference Range HPV - Negative    Comment Normal Reference Range HPV 16- Negative    Comment Normal Reference Range HPV 16 18 45 -Negative   Cervicovaginal  ancillary only   Collection Time: 10/16/23  9:06 AM  Result Value Ref Range   Neisseria Gonorrhea Negative    Chlamydia Negative    Trichomonas Negative    Bacterial Vaginitis (gardnerella) Positive (A)    Candida Vaginitis Negative    Candida Glabrata Negative    Comment      Normal Reference Range Bacterial Vaginosis - Negative   Comment Normal Reference Range Candida Species - Negative    Comment Normal Reference Range Candida Galbrata - Negative    Comment Normal Reference Range Trichomonas - Negative    Comment Normal Reference Ranger Chlamydia - Negative    Comment      Normal Reference Range Neisseria Gonorrhea - Negative   Assessment & Plan:   Problem List Items Addressed This Visit       Other   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  Continue Zoloft  12.5 mg daily.  She will update if she decides to take a full tablet consistently.  Proceed with therapy as scheduled.         No orders of the defined types were placed in this encounter.  No orders of the defined types were placed in this encounter.   I discussed the assessment and treatment plan with the patient. The patient was provided an opportunity to ask questions and all were answered. The patient agreed with the plan and demonstrated an understanding of the instructions. The patient was advised to call back or seek an in-person evaluation if the symptoms worsen or if the condition fails to improve as anticipated.  Follow up plan:  Continue Zoloft  12.5 mg (1/2 tablet) once daily.  Follow-up with therapy scheduled.  It was a pleasure to see you today!   Aeon Koors K Deeanna Beightol, NP

## 2024-04-04 NOTE — Patient Instructions (Signed)
 Continue Zoloft  12.5 mg (1/2 tablet) once daily.  Follow-up with therapy scheduled.  It was a pleasure to see you today!

## 2024-05-14 ENCOUNTER — Other Ambulatory Visit: Payer: Self-pay | Admitting: Primary Care

## 2024-05-14 DIAGNOSIS — F4323 Adjustment disorder with mixed anxiety and depressed mood: Secondary | ICD-10-CM

## 2024-07-12 ENCOUNTER — Ambulatory Visit
